# Patient Record
Sex: Female | Born: 1975 | Race: Black or African American | Hispanic: No | Marital: Married | Smoking: Current some day smoker
Health system: Southern US, Community
[De-identification: ages and names within clinical notes are randomized; demographics above are authoritative.]

## PROBLEM LIST (undated history)

## (undated) DIAGNOSIS — M1990 Unspecified osteoarthritis, unspecified site: Secondary | ICD-10-CM

## (undated) DIAGNOSIS — I10 Essential (primary) hypertension: Secondary | ICD-10-CM

## (undated) DIAGNOSIS — E119 Type 2 diabetes mellitus without complications: Secondary | ICD-10-CM

## (undated) HISTORY — PX: JOINT REPLACEMENT: SHX530

## (undated) HISTORY — PX: BACK SURGERY: SHX140

## (undated) HISTORY — PX: COLONOSCOPY: SHX174

---

## 1999-02-12 ENCOUNTER — Emergency Department (HOSPITAL_COMMUNITY): Admission: EM | Admit: 1999-02-12 | Discharge: 1999-02-12 | Payer: Self-pay | Admitting: Emergency Medicine

## 1999-02-12 ENCOUNTER — Encounter: Payer: Self-pay | Admitting: Emergency Medicine

## 1999-08-28 ENCOUNTER — Emergency Department (HOSPITAL_COMMUNITY): Admission: EM | Admit: 1999-08-28 | Discharge: 1999-08-28 | Payer: Self-pay | Admitting: *Deleted

## 2000-01-11 ENCOUNTER — Emergency Department (HOSPITAL_COMMUNITY): Admission: EM | Admit: 2000-01-11 | Discharge: 2000-01-12 | Payer: Self-pay | Admitting: Emergency Medicine

## 2000-01-12 ENCOUNTER — Encounter: Payer: Self-pay | Admitting: Emergency Medicine

## 2000-05-29 ENCOUNTER — Ambulatory Visit (HOSPITAL_COMMUNITY): Admission: RE | Admit: 2000-05-29 | Discharge: 2000-05-29 | Payer: Self-pay | Admitting: *Deleted

## 2000-08-13 ENCOUNTER — Inpatient Hospital Stay (HOSPITAL_COMMUNITY): Admission: AD | Admit: 2000-08-13 | Discharge: 2000-08-16 | Payer: Self-pay | Admitting: *Deleted

## 2000-11-29 ENCOUNTER — Other Ambulatory Visit: Admission: RE | Admit: 2000-11-29 | Discharge: 2000-11-29 | Payer: Self-pay | Admitting: Obstetrics & Gynecology

## 2000-11-29 ENCOUNTER — Encounter: Admission: RE | Admit: 2000-11-29 | Discharge: 2000-11-29 | Payer: Self-pay | Admitting: Family Medicine

## 2000-12-06 ENCOUNTER — Ambulatory Visit (HOSPITAL_COMMUNITY): Admission: RE | Admit: 2000-12-06 | Discharge: 2000-12-06 | Payer: Self-pay | Admitting: Obstetrics

## 2001-02-12 ENCOUNTER — Emergency Department (HOSPITAL_COMMUNITY): Admission: EM | Admit: 2001-02-12 | Discharge: 2001-02-13 | Payer: Self-pay | Admitting: Emergency Medicine

## 2001-02-13 ENCOUNTER — Encounter: Payer: Self-pay | Admitting: Emergency Medicine

## 2001-03-08 ENCOUNTER — Encounter: Admission: RE | Admit: 2001-03-08 | Discharge: 2001-03-08 | Payer: Self-pay | Admitting: Family Medicine

## 2001-05-03 ENCOUNTER — Encounter: Admission: RE | Admit: 2001-05-03 | Discharge: 2001-05-03 | Payer: Self-pay | Admitting: Family Medicine

## 2001-06-05 ENCOUNTER — Inpatient Hospital Stay (HOSPITAL_COMMUNITY): Admission: AD | Admit: 2001-06-05 | Discharge: 2001-06-08 | Payer: Self-pay | Admitting: *Deleted

## 2001-06-08 ENCOUNTER — Inpatient Hospital Stay (HOSPITAL_COMMUNITY): Admission: AD | Admit: 2001-06-08 | Discharge: 2001-06-13 | Payer: Self-pay | Admitting: Obstetrics & Gynecology

## 2001-06-11 ENCOUNTER — Encounter: Payer: Self-pay | Admitting: *Deleted

## 2001-06-14 ENCOUNTER — Encounter: Admission: RE | Admit: 2001-06-14 | Discharge: 2001-06-14 | Payer: Self-pay | Admitting: Family Medicine

## 2001-07-04 ENCOUNTER — Encounter: Admission: RE | Admit: 2001-07-04 | Discharge: 2001-07-04 | Payer: Self-pay | Admitting: Family Medicine

## 2001-07-31 ENCOUNTER — Encounter: Admission: RE | Admit: 2001-07-31 | Discharge: 2001-07-31 | Payer: Self-pay | Admitting: Family Medicine

## 2002-07-30 ENCOUNTER — Inpatient Hospital Stay (HOSPITAL_COMMUNITY): Admission: AD | Admit: 2002-07-30 | Discharge: 2002-07-30 | Payer: Self-pay | Admitting: *Deleted

## 2002-10-14 ENCOUNTER — Encounter: Admission: RE | Admit: 2002-10-14 | Discharge: 2002-10-14 | Payer: Self-pay | Admitting: Family Medicine

## 2002-10-14 ENCOUNTER — Other Ambulatory Visit: Admission: RE | Admit: 2002-10-14 | Discharge: 2002-10-14 | Payer: Self-pay | Admitting: Family Medicine

## 2002-10-24 HISTORY — PX: TUBAL LIGATION: SHX77

## 2002-10-29 ENCOUNTER — Ambulatory Visit (HOSPITAL_COMMUNITY): Admission: RE | Admit: 2002-10-29 | Discharge: 2002-10-29 | Payer: Self-pay | Admitting: Family Medicine

## 2003-02-14 ENCOUNTER — Encounter (INDEPENDENT_AMBULATORY_CARE_PROVIDER_SITE_OTHER): Payer: Self-pay

## 2003-02-14 ENCOUNTER — Inpatient Hospital Stay (HOSPITAL_COMMUNITY): Admission: AD | Admit: 2003-02-14 | Discharge: 2003-02-18 | Payer: Self-pay | Admitting: Family Medicine

## 2003-06-01 ENCOUNTER — Encounter: Payer: Self-pay | Admitting: Emergency Medicine

## 2003-06-01 ENCOUNTER — Encounter: Payer: Self-pay | Admitting: Orthopedic Surgery

## 2003-06-01 ENCOUNTER — Emergency Department (HOSPITAL_COMMUNITY): Admission: EM | Admit: 2003-06-01 | Discharge: 2003-06-01 | Payer: Self-pay | Admitting: *Deleted

## 2005-08-15 ENCOUNTER — Ambulatory Visit: Payer: Self-pay | Admitting: Family Medicine

## 2005-11-30 ENCOUNTER — Ambulatory Visit: Payer: Self-pay | Admitting: Family Medicine

## 2008-02-19 ENCOUNTER — Emergency Department (HOSPITAL_COMMUNITY): Admission: EM | Admit: 2008-02-19 | Discharge: 2008-02-19 | Payer: Self-pay | Admitting: Emergency Medicine

## 2009-03-20 ENCOUNTER — Ambulatory Visit: Payer: Self-pay | Admitting: Radiology

## 2009-03-20 ENCOUNTER — Emergency Department (HOSPITAL_BASED_OUTPATIENT_CLINIC_OR_DEPARTMENT_OTHER): Admission: EM | Admit: 2009-03-20 | Discharge: 2009-03-20 | Payer: Self-pay | Admitting: Emergency Medicine

## 2010-11-25 ENCOUNTER — Ambulatory Visit: Admit: 2010-11-25 | Payer: Self-pay | Admitting: Nurse Practitioner

## 2011-02-18 ENCOUNTER — Other Ambulatory Visit: Payer: Self-pay | Admitting: Orthopedic Surgery

## 2011-02-18 DIAGNOSIS — M25529 Pain in unspecified elbow: Secondary | ICD-10-CM

## 2011-02-22 ENCOUNTER — Other Ambulatory Visit: Payer: Self-pay

## 2011-02-23 ENCOUNTER — Inpatient Hospital Stay: Admission: RE | Admit: 2011-02-23 | Payer: Self-pay | Source: Ambulatory Visit

## 2011-03-11 NOTE — Consult Note (Signed)
[facility]  Patient:    Kristi Daniels, Kristi Daniels Visit Number: [id number] MRN: [id number]          Service Type: MED Location: [phone] Attending Physician:  Antionette Char Consultation Date: 06/11/01 Adm. Date:  [id number]   CC:         Roseanna Rainbow, M.D.  [facility] c/o Dr. Clide Dales   Consultation Report  NEUROLOGY CONSULTATION  ADMISSION DATE:               06/08/2001.  DATE OF BIRTH:                11-06-1975.  I was asked to see Kristi Daniels, a 35 year old gravida 4, para 3 woman, who has had headaches and hypertension since delivering her third child. The patient delivered the child on August 13 and was in the hospital for three days. Her blood pressures were quite labile. She was sent home on pain medication and came back the same day with a very severe headache and blood pressure of 230/120.  She was treated aggressively initially with labetalol with good effect and then with magnesium sulfate with only fair effect. The patient is receiving 2.5 g/hr and is achieved a level of 7.2 mg/dl. This has failed to control her blood pressure. In addition, has failed to control her headaches.  She describes the pain as retrorbital, pounding, associated with definite sensitivity to light and some sensitivity to sound. She has not had true nausea or vomiting. She is incapacitated by the headaches and has not responded to Percocet, Darvocet, Tylenol, or Motrin.  I was asked to see her to determine the etiology of her headaches and to make recommendations for further workup and treatment.  REVIEW OF SYSTEMS:            The patient has had no closed head injury. She did not have significant headache problems throughout the pregnancy, and only developed them toward the end, in part, associated with her hypertension.  The patient has not had significant problems with either muscle contraction or migraine headaches in  the past. She has not had muscle contraction or migrainous headaches in the previous two pregnancies. She has not had significant intercurrent infections in the head, neck, lungs, GI, GU. No rash, anemia, bruisability, diabetes, or thyroid disease. Neurologically, she has not had diplopia, dysarthria, dysphasia, tinnitus, syncope, vertigo, weakness, numbness, tingling, or loss of bowel and bladder control. When she came in she had blurred vision. She also had numbness down her left side involving the arm moreso than the leg with some numbness in the right hand. The symptoms were more of paresthesias than anything else. Those symptoms have subsided as has her blurred vision. Her headaches have gone from very severe pain down to moderate pain. Light and increased activity seem to worsen things. She has been able to keep down foods. I was asked to see her to determine the etiologies of the headaches.  PAST SURGICAL HISTORY:        None.  FAMILY HISTORY:               Migraine headaches in mother. No other history of significant pregnancy induced hypertension, diabetes, and no familial neurologic disorders.  SOCIAL HISTORY:               The patient is a single parent. She lives with her mother and stepfather. Her stepfathers sister is taking care of the older two children and  maternal grandmother is taking care of the baby.  The patient has graduated from 12th grade from [employer] and hopes to go on to study to be CNA. This has been thwarted by pregnancies and children now age 34, 47 months, and newborn.  I am unaware of use of tobacco or alcohol in this patient. She has been tried on a variety of medications which have failed, see above.  ALLERGIES:                    She has no known allergies to medications.  CURRENT MEDICATIONS:          Magnesium sulfate, Motrin, and Percocet. She had been on Darvocet as well.  PHYSICAL EXAMINATION TODAY:  GENERAL:                       This is a pleasant woman in no acute distress. She says that her headache is 6 on a scale of 10.  VITALS SIGNS:                 Blood pressure 153/102, resting pulse 79, respirations 20, temperature 97.8. Pulse oximetry 98%.  EARS, NOSE, AND THROAT:       No signs of infection.  NECK:                         Supple. Range of motion full. No bruits.  No tenderness in the external auditory canals, temporomandibular joints, temples, anterior cervical spine, or craniocervical junction. She has slight orbital pain both supra- and infra- as well as the eye itself.  LUNGS:                        Clear.  HEART:                        No murmur, pulses normal.  ABDOMEN:                      Soft, bowel sounds normal, no hepatosplenomegaly.  EXTREMITIES:                  No edema or cyanosis.  NEUROLOGIC:                   Mental status: Patient was awake, alert, no dysphasia or dyspraxia. Cranial nerves: Round, reactive pupils with photophobia. Fundi were sharp. Discs normal vessels, no hemorrhage. Visual fields full. Symmetric facial strength. Midline tongue and uvula. Air conduction greater than bone conduction bilaterally.  Motor examination: Normal strength, tone, and mass. Good fine motor movements. No pronator, drift. Sensation intact to cold, vibration, and stereognosis. Cerebellar examination: Good finger-to-nose, rapid repetitive movements, no tremors, dystaxia, dysmetria. Gait and station was normal. Patient was able to get up on her heels and toes and perform tandem without falling. Romberg was negative.  Deep tendon reflexes were normal at the knees and biceps, absent elsewhere. She had bilateral flexor plantar responses.  IMPRESSION:                   1. Headache disorder. I suspect migraine, now                                  without aura, 346.10. The patient may have  had an initial aura with spots before her eye                                    and numbness.                               2. Hypertension versus toxemia, 404.10.                               3. Gravida 4, para 3-0-1-3 woman.                               4. Nonfocal neurologic examination.                               5. Cranial CT scan reviewed personally by me was                                  normal without and with contrast.  RECOMMENDATIONS:              1. Before we can treat her headache with DHE or                                  triptan medications, we need to get her blood                                  pressure under control.                               2. Discontinue magnesium sulfate. Magnesium                                  sometimes will treat migraines but obviously                                  has not controlled her headaches nor her                                  hypertension.                               3. Start a beta blocker propanolol 30 mg twice                                  a day or atenolol 25 mg twice a day.                               4. If blood pressure is down to the range of  120-130/80 and headaches continue, we can use                                  DHE 45 1 ml IV preceded 15 minutes before by                                  Reglan 10 mg IV to decrease the side effects                                  of nausea. Decadron 10 mg IV follows DHE.                                  This could be repeated every eight hours up                                  to nine times. I discussed this with                                  Dr. Burnadette Peter.  I appreciate the opportunity to see the patient and will follow up based on her clinical needs. DD:  06/11/01 TD:  06/11/01 Job: [id number]

## 2011-03-11 NOTE — Discharge Summary (Signed)
[facility]  Patient:    Kristi Daniels, Kristi Daniels Visit Number: [id number] MRN: [id number]          Service Type: MED Location: [phone] Attending Physician:  Antionette Char Dictated by:   Kevin Fenton, M.D. Adm. Date:  06/08/2001 Disc. Date: 06/13/2001                             Discharge Summary  DATE OF BIRTH:                [birth year].  DISCHARGE MEDICATIONS:        Hydrochlorothiazide 25 mg p.o. q.d.  DISCHARGE FOLLOWUP:           The patient is to follow up at [facility] tomorrow, Thursday, August 22, at 10:15. She is notify family practice center for any headache.  DISCHARGE DIAGNOSES:          1. Preeclampsia.                               2. Headache.  CONSULTS:                     Dr. Sharene Skeans of neurology.  BRIEF HOSPITAL COURSE:        This is a 35 year old, G4, P3-0-1-3, who is postpartum day #3 when she presented to the MAU with headache and blood pressures of up to 211/132, 178/95, 203/113. She had been given magnesium in labor secondary to scotoma, increased blood pressures and elevated uric acid. The patient had an SVD and was transferred to [facility]. That was continued for 24 hours and patient was discharged with diuresis. However, the patient did return that same day with a headache, scotoma, and elevated blood pressures. She was admitted for the blood pressures and the headache. The patient received a CT scan of the head to rule out bleed which was negative. We also consulted neurology secondary to persistent headaches. Dr. Sharene Skeans saw her and felt she had a nonfocal neuro exam and diagnosed a headache disorder suspecting migraine without aura and chronic hypertension versus toxemia. His recommendation was to get her blood pressure under control before offering ergotamines or triptans, and to start her on propanolol 30 mg b.i.d. or atenolol 25 mg to control her blood pressure. Once the patient had  normal blood pressure she would be able to start on DHE and Reglan or ______ for her headaches. The patient was started on propanolol 40 mg b.i.d.; however at discharge the patient still had blood pressures of 150s-160s/90s, however, they were stable, so the patient was discharged, propanolol was discontinued and patient was started on hydrochlorothiazide 25 mg po q.d. She is to follow up with Dr. Lennette Bihari as described above. At discharge, she no longer had headache and was doing well. Dictated by:   Kevin Fenton, M.D. Attending Physician:  Antionette Char DD:  06/13/01 TD:  06/13/01 Job: [id number]

## 2011-03-11 NOTE — Discharge Summary (Signed)
   NAME:  Kristi Daniels, NARDELLI                       ACCOUNT NO.:  [id number]   MEDICAL RECORD NO.:  [id number]                   PATIENT TYPE:  INP   LOCATION:  9124                                 FACILITY:  [facility]   PHYSICIAN:  Tanya S. Shawnie Pons, M.D.                DATE OF BIRTH:  [birth year]   DATE OF ADMISSION:  02/14/2003  DATE OF DISCHARGE:  02/18/2003                                 DISCHARGE SUMMARY   DISCHARGE DIAGNOSES:  1. Intrauterine pregnancy at [redacted] weeks gestation, delivered.  2. Abruptio placenta.  3. Pregnancy-induced hypertension.   PROCEDURES:  Primary low transverse C section as well as IV antibiotics and  magnesium sulfate.   REASON FOR ADMISSION:  Briefly, the patient is a 35 year old G4 P3-0-0-3 who  is at 75 weeks who presented with acute abdominal pain, vaginal bleeding,  had a nonreassuring fetal heart rate tracing and was immediately taken to  the OR for a cesarean delivery.   HOSPITAL COURSE:  As stated previously, the patient was taken to the OR for  cesarean delivery and she delivered a viable female infant with Apgars of 4  and 7 and a cord pH of 6.9.  The patient's blood pressures were noted to be  elevated in the 150 to 160s over 90 to 100.  She had 4+ DTRs and clonus and  was transferred to the AICU postoperatively and given magnesium sulfate.  Her magnesium was continued for approximately 48 hours postoperatively  before being discontinued.  She had had good urine output and a good amount  of diuresis and was therefore transferred to the floor on postoperative day  #2.  She remained afebrile.  Blood pressures remained elevated on  postoperative day #3.  She was watched for anther day and her PIH labs  seemed to be improving; her blood pressures were also improving.  It was  felt she was stable for discharge.   DISCHARGE DISPOSITION:  She was discharged home.   CONDITION:  Good.   PERTINENT MEDICATIONS:  She was on ibuprofen, Percocet, prenatal  vitamins,  and Toprol-XL.   ACTIVITY:  Pelvic rest for the next six weeks.   DIET:  Regular.   FOLLOW-UP:  In six weeks at [facility] for a postoperative visit.   DISCHARGE LABORATORY DATA:  Her discharge hemoglobin was 9.7.                                               Shelbie Proctor. Shawnie Pons, M.D.    TSP/MEDQ  D:  04/10/2003  T:  04/10/2003  Job:  [id number]

## 2011-03-11 NOTE — Op Note (Signed)
NAME:  Kristi Daniels, Kristi Daniels                       ACCOUNT NO.:  [id number]   MEDICAL RECORD NO.:  [id number]                   PATIENT TYPE:  INP   LOCATION:  9157                                 FACILITY:  [facility]   PHYSICIAN:  Phil D. Okey Dupre, M.D.                  DATE OF BIRTH:  1976-09-26   DATE OF PROCEDURE:  02/14/2003  DATE OF DISCHARGE:                                 OPERATIVE REPORT   PROCEDURE:  1. Low transverse cesarean section.  2. Bilateral tubal ligation, modified [place]oy.   PREOPERATIVE DIAGNOSES:  1. Acute abruptio placenta.  2. Voluntary sterilization.   SURGEON:  Javier Glazier. Okey Dupre, M.D.   FIRST ASSISTANT:  Franco Collet, M.D.   ANESTHESIA:  General.   OPERATIVE FINDINGS:  On entering the uterine cavity the cavity was full of  blood.  The baby after being delivered and the placenta separating quickly,  large clots were found in the fundus of the uterus.  There was Couvelaire  uterus, however.   PROCEDURE:  Under satisfactory general anesthesia with the patient in a  dorsal supine position, the abdomen was prepped and draped in the usual  sterile manner with a Foley catheter in the urinary bladder and entered  through a midline incision extending from just under the umbilicus to just  above the symphysis pubis.  The abdomen was entered by layers.  On entering  the peritoneal cavity the visceroperitoneum on the anterior surface of the  uterus opened transversely by sharp dissection.  The bladder pushed away  from the lower uterine segment which was entered by sharp and blunt  dissection and large clots extruded from the uterus immediately on entry  there was a scant amount of fluid and the baby quickly delivered from a  vertex presentation.  The cord was milked toward the baby as per  pediatrician.  Cord doubly clamped, divided.  Baby handed to pediatricians.  Placenta followed spontaneously.  No organized clot could be seen behind the  placenta.  However, there was  much organized clot at the fundus of the  uterus which probably was equivalent to 1 unit of blood evacuated.  The  uterus was then closed with continuous running locked 0 Vicryl in an  atraumatic needle and an area of oozing in the mid portion of the suture  line was controlled with another figure-of-eight of the same suture.  The  area observed.  No bleeding was noted.  Each fallopian tube was grasped in  the mid portion and an opening made with a hemostat and an avascular portion  of meso beneath the tube.  One plain suture was brought through this  opening, tied around the distal and proximal end of the tube forming a loop  of approximately 2 cm above the tube.  Second tie placed just below the  aforementioned tie.  That was cut short.  Portion of the tube above the  sutures were then excised and the ends of those sutures caused by that  excision were coagulated with hot cautery.  Area was observed for bleeding.  None was noted.  The fascia was closed with continuous running alternating  locked 1 PDS suture on an atraumatic needle.  Subcutaneous bleeders were  controlled with hot cautery and skin edge approximated with skin staples.  Dry, sterile  dressing was applied.  The patient transferred to recovery room with  approximately 600 mL blood loss.  Foley catheter draining clear amber urine  at the end of procedure.  Placenta was sent for pathologic diagnosis.  The  baby was a female.  No weight available at this time.  Apgar of 4/7 and a pH  of 6.9.                                               Phil D. Okey Dupre, M.D.    PDR/MEDQ  D:  02/14/2003  T:  02/14/2003  Job:  [id number]

## 2011-07-19 LAB — POCT I-STAT, CHEM 8
BUN: 10
Chloride: 104
HCT: 46
Potassium: 3.8
Sodium: 138

## 2011-07-19 LAB — POCT CARDIAC MARKERS
Myoglobin, poc: 85.9
Operator id: 146091
Troponin i, poc: <0.05

## 2011-07-19 LAB — CBC
HCT: 42.4
Platelets: 266
WBC: 9.2

## 2015-03-22 ENCOUNTER — Encounter (HOSPITAL_COMMUNITY): Payer: Self-pay | Admitting: *Deleted

## 2015-03-22 ENCOUNTER — Emergency Department (HOSPITAL_COMMUNITY)
Admission: EM | Admit: 2015-03-22 | Discharge: 2015-03-22 | Disposition: A | Discharge status: Home / Self Care | Payer: Self-pay | Attending: Emergency Medicine | Admitting: Emergency Medicine

## 2015-03-22 DIAGNOSIS — M62838 Other muscle spasm: Secondary | ICD-10-CM | POA: Insufficient documentation

## 2015-03-22 MED ORDER — MELOXICAM 7.5 MG PO TABS: 15.0000 mg | ORAL_TABLET | Freq: Every day | ORAL | Status: DC

## 2015-03-22 MED ORDER — HYDROCODONE-ACETAMINOPHEN 5-325 MG PO TABS
2.0000 | ORAL_TABLET | Freq: Once | ORAL | Status: AC
  Administered 2015-03-22: 2 via ORAL
  Filled 2015-03-22: qty 2

## 2015-03-22 MED ORDER — DIAZEPAM 5 MG PO TABS: 5.0000 mg | ORAL_TABLET | Freq: Two times a day (BID) | ORAL | Status: DC

## 2015-03-22 MED ORDER — HYDROCODONE-ACETAMINOPHEN 5-325 MG PO TABS: 1.0000 | ORAL_TABLET | ORAL | Status: DC | PRN

## 2015-03-22 MED ORDER — DIAZEPAM 5 MG PO TABS
5.0000 mg | ORAL_TABLET | Freq: Once | ORAL | Status: AC
  Administered 2015-03-22: 5 mg via ORAL
  Filled 2015-03-22: qty 1

## 2015-03-22 NOTE — ED Provider Notes (Signed)
CSN: [id number]     Arrival date & time 03/22/15  4098 History   First MD Initiated Contact with Patient 03/22/15 0350     Chief Complaint  Patient presents with  . Neck Pain     (Consider location/radiation/quality/duration/timing/severity/associated sxs/prior Treatment) HPI Comments: Patient is 39 year old female who presents to the emergency department for further evaluation of right-sided neck pain. Patient states that symptoms began upon waking yesterday. She states that pain has progressively worsened. She describes the pain as an aching, burning pain. Patient denies radiation of the pain. She states that she took 2 Excedrin tablets for symptoms without relief. Pain is worse with extension of the neck and movement of the right shoulder. She denies any direct trauma or injury to the area, though she states that she does frequent heavy lifting at work. She states that she always tries to lift "the right away". Patient denies fever, extremity numbness/paresthesias, and extremity weakness. She reports similar symptoms in the past which resolved after 1-2 days.  Patient is a 39 y.o. female presenting with neck pain. The history is provided by the patient. No language interpreter was used.  Neck Pain   History reviewed. No pertinent past medical history. Past Surgical History  Procedure Laterality Date  . Joint replacement      elbow    History reviewed. No pertinent family history. History  Substance Use Topics  . Smoking status: Never Smoker   . Smokeless tobacco: Never Used  . Alcohol Use: No   OB History    No data available      Review of Systems  Musculoskeletal: Positive for myalgias and neck pain.  All other systems reviewed and are negative.   Allergies  Review of patient's allergies indicates no known allergies.  Home Medications   Prior to Admission medications   Medication Sig Start Date End Date Taking? Authorizing Provider  diazepam (VALIUM) 5 MG tablet  Take 1 tablet (5 mg total) by mouth 2 (two) times daily. 03/22/15   Antony Madura, PA-C  HYDROcodone-acetaminophen (NORCO/VICODIN) 5-325 MG per tablet Take 1 tablet by mouth every 4 (four) hours as needed for severe pain. 03/22/15   Antony Madura, PA-C  meloxicam (MOBIC) 7.5 MG tablet Take 2 tablets (15 mg total) by mouth daily. 03/22/15   Antony Madura, PA-C   BP 156/95 mmHg  Pulse 70  Temp(Src) 97.8 F (36.6 C) (Oral)  Ht  (1.626 m)  Wt 180 lb (81.647 kg)  BMI 30.88 kg/m2  SpO2 99%  LMP 02/23/2015   Physical Exam  Constitutional: She is oriented to person, place, and time. She appears well-developed and well-nourished. No distress.  Nontoxic/nonseptic appearing  HENT:  Head: Normocephalic and atraumatic.  Eyes: Conjunctivae and EOM are normal. No scleral icterus.  Neck: Normal range of motion.  Normal range of motion of the neck. Tenderness to palpation to the right cervical paraspinal muscles which is mild. No bony deformities, step-offs, or crepitus.  Cardiovascular: Normal rate, regular rhythm and intact distal pulses.   Distal radial pulse 2+ bilaterally  Pulmonary/Chest: Effort normal. No respiratory distress.  Respirations even and unlabored  Musculoskeletal: Normal range of motion. She exhibits tenderness.       Back:  Tenderness to palpation along the course of the right trapezius muscle. There is mild spasm. No bony deformities, step-offs, or crepitus to the thoracic midline.  Neurological: She is alert and oriented to person, place, and time. She exhibits normal muscle tone. Coordination normal.  GCS 15. Patient  has equal grip strength bilaterally. 5/5 strength against resistance noted in bilateral upper extremities.  Skin: Skin is warm and dry. No rash noted. She is not diaphoretic. No erythema. No pallor.  Psychiatric: She has a normal mood and affect. Her behavior is normal.  Nursing note and vitals reviewed.   ED Course  Procedures (including critical care  time) Labs Review Labs Reviewed - No data to display  Imaging Review No results found.   EKG Interpretation None      MDM   Final diagnoses:  Trapezius muscle spasm    39 year old female presents to the emergency department for further evaluation of right-sided neck pain. Symptoms consistent with muscle spasm. Patient is neurovascularly intact. No direct trauma or injury to raise concern for underlying bony process. Patient has equal grip strength bilaterally with normal strength against resistance. Sensation to light touch intact. Symptoms to be managed as outpatient with NSAIDs and muscle relaxers. Short course Norco given for pain control as needed. Have advised ice and heat to area of pain. Primary care follow up advised and return precautions given. Patient agreeable to plan with no unaddressed concerns. Patient discharged in good condition.   Filed Vitals:   03/22/15 0337 03/22/15 0342 03/22/15 0345  BP: 170/105  156/95  Pulse:  78 70  Temp:  97.8 F (36.6 C)   TempSrc:  Oral   Height:  5\' 4"  (1.626 m)   Weight:  180 lb (81.647 kg)   SpO2:  99% 99%       Antony MaduraKelly Eldonna Neuenfeldt, PA-C 03/22/15 0409  Derwood KaplanAnkit Nanavati, MD 03/23/15 0830

## 2015-03-22 NOTE — ED Notes (Signed)
Pt. Woke up yesterday with a sore neck and as the day progressed the pain got worse. Pt. States she cant turn her neck

## 2015-03-22 NOTE — ED Notes (Signed)
Pt. Left with all belongings and refused wheelchair 

## 2015-03-22 NOTE — Discharge Instructions (Signed)
Recommend Mobic and Valium as prescribed for pain and spasm. Take Norco as needed for severe pain. Alternate ice and heat to areas of injury, 3-4 times per day for 15-20 minutes each time. Follow-up with your primary care doctor for a recheck of symptoms. Return to the emergency department as needed if symptoms worsen.  Muscle Cramps and Spasms Muscle cramps and spasms occur when a muscle or muscles tighten and you have no control over this tightening (involuntary muscle contraction). They are a common problem and can develop in any muscle. The most common place is in the calf muscles of the leg. Both muscle cramps and muscle spasms are involuntary muscle contractions, but they also have differences:   Muscle cramps are sporadic and painful. They may last a few seconds to a quarter of an hour. Muscle cramps are often more forceful and last longer than muscle spasms.  Muscle spasms may or may not be painful. They may also last just a few seconds or much longer. CAUSES  It is uncommon for cramps or spasms to be due to a serious underlying problem. In many cases, the cause of cramps or spasms is unknown. Some common causes are:   Overexertion.   Overuse from repetitive motions (doing the same thing over and over).   Remaining in a certain position for a long period of time.   Improper preparation, form, or technique while performing a sport or activity.   Dehydration.   Injury.   Side effects of some medicines.   Abnormally low levels of the salts and ions in your blood (electrolytes), especially potassium and calcium. This could happen if you are taking water pills (diuretics) or you are pregnant.  Some underlying medical problems can make it more likely to develop cramps or spasms. These include, but are not limited to:   Diabetes.   Parkinson disease.   Hormone disorders, such as thyroid problems.   Alcohol abuse.   Diseases specific to muscles, joints, and bones.    Blood vessel disease where not enough blood is getting to the muscles.  HOME CARE INSTRUCTIONS   Stay well hydrated. Drink enough water and fluids to keep your urine clear or pale yellow.  It may be helpful to massage, stretch, and relax the affected muscle.  For tight or tense muscles, use a warm towel, heating pad, or hot shower water directed to the affected area.  If you are sore or have pain after a cramp or spasm, applying ice to the affected area may relieve discomfort.  Put ice in a plastic bag.  Place a towel between your skin and the bag.  Leave the ice on for 15-20 minutes, 03-04 times a day.  Medicines used to treat a known cause of cramps or spasms may help reduce their frequency or severity. Only take over-the-counter or prescription medicines as directed by your caregiver. SEEK MEDICAL CARE IF:  Your cramps or spasms get more severe, more frequent, or do not improve over time.  MAKE SURE YOU:   Understand these instructions.  Will watch your condition.  Will get help right away if you are not doing well or get worse. Document Released: 04/01/2002 Document Revised: 02/04/2013 Document Reviewed: 09/26/2012 [facility] Patient Information 2015 [employer] [place], [place]. This information is not intended to replace advice given to you by your health care provider. Make sure you discuss any questions you have with your health care provider.

## 2015-08-19 ENCOUNTER — Emergency Department (HOSPITAL_COMMUNITY)
Admission: EM | Admit: 2015-08-19 | Discharge: 2015-08-19 | Disposition: A | Discharge status: Home / Self Care | Payer: Self-pay | Attending: Emergency Medicine | Admitting: Emergency Medicine

## 2015-08-19 ENCOUNTER — Encounter (HOSPITAL_COMMUNITY): Payer: Self-pay | Admitting: Emergency Medicine

## 2015-08-19 DIAGNOSIS — X58XXXA Exposure to other specified factors, initial encounter: Secondary | ICD-10-CM | POA: Insufficient documentation

## 2015-08-19 DIAGNOSIS — Z791 Long term (current) use of non-steroidal anti-inflammatories (NSAID): Secondary | ICD-10-CM | POA: Insufficient documentation

## 2015-08-19 DIAGNOSIS — M779 Enthesopathy, unspecified: Secondary | ICD-10-CM | POA: Insufficient documentation

## 2015-08-19 DIAGNOSIS — S46911A Strain of unspecified muscle, fascia and tendon at shoulder and upper arm level, right arm, initial encounter: Secondary | ICD-10-CM | POA: Insufficient documentation

## 2015-08-19 DIAGNOSIS — Y9389 Activity, other specified: Secondary | ICD-10-CM | POA: Insufficient documentation

## 2015-08-19 DIAGNOSIS — T148XXA Other injury of unspecified body region, initial encounter: Secondary | ICD-10-CM

## 2015-08-19 DIAGNOSIS — Y9289 Other specified places as the place of occurrence of the external cause: Secondary | ICD-10-CM | POA: Insufficient documentation

## 2015-08-19 DIAGNOSIS — Z79899 Other long term (current) drug therapy: Secondary | ICD-10-CM | POA: Insufficient documentation

## 2015-08-19 DIAGNOSIS — Y998 Other external cause status: Secondary | ICD-10-CM | POA: Insufficient documentation

## 2015-08-19 MED ORDER — PREDNISONE 10 MG PO TABS: ORAL_TABLET | ORAL | Status: DC

## 2015-08-19 MED ORDER — CYCLOBENZAPRINE HCL 5 MG PO TABS: 10.0000 mg | ORAL_TABLET | Freq: Two times a day (BID) | ORAL | Status: DC | PRN

## 2015-08-19 NOTE — Discharge Instructions (Signed)
Tendinitis and Tenosynovitis  °Tendinitis is inflammation of the tendon. Tenosynovitis is inflammation of the lining around the tendon (tendon sheath). These painful conditions often occur at once. Tendons attach muscle to bone. To move a limb, force from the muscle moves through the tendon, to the bone. These conditions often cause increased pain when moving. Tendinitis may be caused by a small or partial tear in the tendon.  °SYMPTOMS  °· Pain, tenderness, redness, bruising, or swelling at the injury. °· Loss of normal joint movement. °· Pain that gets worse with use of the muscle and joint attached to the tendon. °· Weakness in the tendon, caused by calcium build up that may occur with tendinitis. °· Commonly affected tendons: °¨ Achilles tendon (calf of leg). °¨ Rotator cuff (shoulder joint). °¨ Patellar tendon (kneecap to shin). °¨ Peroneal tendon (ankle). °¨ Posterior tibial tendon (inner ankle). °¨ Biceps tendon (in front of shoulder). °CAUSES  °· Sudden strain on a flexed muscle, muscle overuse, sudden increase or change in activity, vigorous activity. °· Result of a direct hit (less common). °· Poor muscle action (biomechanics). °RISK INCREASES WITH: °· Injury (trauma). °· Too much exercise. °· Sudden change in athletic activity. °· Incorrect exercise form or technique. °· Poor strength and flexibility. °· Not warming-up properly before activity. °· Returning to activity before healing is complete. °PREVENTION  °· Warm-up and stretch properly before activity. °· Maintain physical fitness: °¨ Joint flexibility. °¨ Muscle strength and endurance. °¨ Fitness that increases heart rate. °· Learn and use proper exercise techniques. °· Use rehabilitation exercises to strengthen weak muscles and tendons. °· Ice the tendon after activity, to reduce recurring inflammation. °· Wear proper fitting protective equipment for specific tendons, when indicated. °PROGNOSIS  °When treated properly, can be cured in 6 to 8 weeks.  Recovery may take longer, depending on degree of injury.  °RELATED COMPLICATIONS  °· Re-injury or recurring symptoms. °· Permanent weakness or joint stiffness, if injury is severe and recovery is not completed. °· Delayed healing, if sports are started before healing is complete. °· Tearing apart (rupture) of the inflamed tendon. Tendinitis means the tendon is injured and must recover. °TREATMENT  °Treatment first involves ice, medicine, and rest from aggravating activities. This reduces pain and inflammation. Modifying your activity may be considered to prevent recurring injury. A brace, elastic bandage wrap, splint, cast, or sling may be prescribed to protect the joint for a short period. After that period, strengthening and stretching exercise may help to regain strength and full range of motion. If the condition persists, despite non-surgical treatment, surgery may be recommended to remove the inflamed tendon lining. Corticosteroid injections may be given to reduce inflammation. However, these injections may weaken the tendon and increase your risk for tendon rupture. °MEDICATION  °· If pain medicine is needed, nonsteroidal anti-inflammatory medicines (aspirin and ibuprofen), or other minor pain relievers (acetaminophen), are often recommended. °· Do not take pain medicine for 7 days before surgery. °· Prescription pain relievers are usually prescribed only after surgery. Use only as directed and only as much as you need. °· Ointments applied to the skin may be helpful. °· Corticosteroid injections may be given to reduce inflammation. However, this may increase your risk of a tendon rupture. °HEAT AND COLD °· Cold treatment (icing) relieves pain and reduces inflammation. Cold treatment should be applied for 10 to 15 minutes every 2 to 3 hours, and immediately after activity that aggravates your symptoms. Use ice packs or an ice massage. °· Heat   treatment may be used before performing stretching and strengthening  activities prescribed by your caregiver, physical therapist, or athletic trainer. Use a heat pack or a warm water soak. °SEEK MEDICAL CARE IF:  °· Symptoms get worse or do not improve, despite treatment. °· Pain becomes too much to tolerate. °· You develop numbness or tingling. °· Toes become cold, or toenails become blue, gray, or dark colored. °· New, unexplained symptoms develop. (Drugs used in treatment may produce side effects.) °  °This information is not intended to replace advice given to you by your health care provider. Make sure you discuss any questions you have with your health care provider. °  °Document Released: 10/10/2005 Document Revised: 01/02/2012 Document Reviewed: 01/22/2009 °Elsevier Interactive Patient Education ©2016 Elsevier Inc. ° °

## 2015-08-19 NOTE — ED Notes (Signed)
Pt sts right shoulder pain into elbow x several weeks getting more severe

## 2015-08-19 NOTE — ED Notes (Signed)
Pt is in stable condition upon d/c and ambulates from ED. 

## 2015-08-19 NOTE — ED Provider Notes (Signed)
CSN: [id number]     Arrival date & time 08/19/15  1625 History  By signing my name below, I, Kristi Daniels, attest that this documentation has been prepared under the direction and in the presence of Teressa Lower, NP. Electronically Signed: Lyndel Daniels, ED Scribe. 08/19/2015. 4:48 PM.   Chief Complaint  Patient presents with  . Shoulder Pain   The history is provided by the patient. No language interpreter was used.   HPI Comments: Kristi Daniels is a 39 y.o. female, with no chronic medical conditions, who presents to the Emergency Department complaining of gradually worsening, constant posterior right shoulder pain that radiates to right elbow onset 3 days ago.The pt uses her right arm for repetitive motions while at work. She notes similar pain in her right shoulder in the past but she did not experience pain in her elbow at that time. No PMhx of DM. Denies numbness or weakness. No overlying skin changes. NKDA. LNMP this month.   History reviewed. No pertinent past medical history. Past Surgical History  Procedure Laterality Date  . Joint replacement      elbow    History reviewed. No pertinent family history. Social History  Substance Use Topics  . Smoking status: Never Smoker   . Smokeless tobacco: Never Used  . Alcohol Use: No   OB History    No data available     Review of Systems  Musculoskeletal: Positive for arthralgias ( right shoulder, right elbow ).  Skin: Negative for color change.  Neurological: Negative for weakness and numbness.  All other systems reviewed and are negative.  Allergies  Review of patient's allergies indicates no known allergies.  Home Medications   Prior to Admission medications   Medication Sig Start Date End Date Taking? Authorizing Provider  diazepam (VALIUM) 5 MG tablet Take 1 tablet (5 mg total) by mouth 2 (two) times daily. 03/22/15   Antony Madura, PA-C  HYDROcodone-acetaminophen (NORCO/VICODIN) 5-325 MG per tablet Take 1  tablet by mouth every 4 (four) hours as needed for severe pain. 03/22/15   Antony Madura, PA-C  meloxicam (MOBIC) 7.5 MG tablet Take 2 tablets (15 mg total) by mouth daily. 03/22/15   Antony Madura, PA-C   BP 148/87 mmHg  Pulse 90  Temp(Src) 97.6 F (36.4 C) (Oral)  Resp 18  SpO2 98% Physical Exam  Constitutional: She is oriented to person, place, and time. She appears well-developed and well-nourished. No distress.  HENT:  Head: Normocephalic.  Eyes: Conjunctivae are normal.  Neck: Normal range of motion. Neck supple.  Cardiovascular: Normal rate.   Pulmonary/Chest: Effort normal. No respiratory distress.  Abdominal: Soft. Bowel sounds are normal. There is no tenderness.  Musculoskeletal: Normal range of motion. She exhibits no tenderness.  Tender to the lateral elbow. No redness or swelling. Full rom. Tender in the posterior right shoulder. Full rom  Neurological: She is alert and oriented to person, place, and time. She exhibits normal muscle tone. Coordination normal.  Equal grip strength bilaterally  Skin: Skin is warm and dry.  Psychiatric: She has a normal mood and affect. Her behavior is normal.  Nursing note and vitals reviewed.  ED Course  Procedures  DIAGNOSTIC STUDIES: Oxygen Saturation is 98% on RA, normal by my interpretation.    COORDINATION OF CARE: 4:49 PM Discussed treatment plan with pt at bedside and pt agreed to plan. Will prescribe steroid course and give work note.    MDM   Final diagnoses:  Tendonitis  Muscle strain  Will treat with flexeril and prednisone. Discussed follow up and return precautions.   I personally performed the services described in this documentation, which was scribed in my presence. The recorded information has been reviewed and is accurate.    Teressa LowerVrinda Fue Cervenka, NP 08/19/15 1741  Cathren LaineKevin Steinl, MD 08/19/15 [phone]

## 2015-09-24 ENCOUNTER — Emergency Department (HOSPITAL_COMMUNITY)
Admission: EM | Admit: 2015-09-24 | Discharge: 2015-09-24 | Disposition: A | Discharge status: Home / Self Care | Payer: Managed Care, Other (non HMO) | Attending: Emergency Medicine | Admitting: Emergency Medicine

## 2015-09-24 ENCOUNTER — Encounter (HOSPITAL_COMMUNITY): Payer: Self-pay | Admitting: *Deleted

## 2015-09-24 ENCOUNTER — Encounter (HOSPITAL_COMMUNITY): Payer: Self-pay

## 2015-09-24 ENCOUNTER — Emergency Department (HOSPITAL_COMMUNITY)
Admission: EM | Admit: 2015-09-24 | Discharge: 2015-09-24 | Discharge status: Against Medical Advice | Payer: Managed Care, Other (non HMO) | Source: Home / Self Care

## 2015-09-24 DIAGNOSIS — I10 Essential (primary) hypertension: Secondary | ICD-10-CM | POA: Diagnosis not present

## 2015-09-24 DIAGNOSIS — R197 Diarrhea, unspecified: Secondary | ICD-10-CM | POA: Insufficient documentation

## 2015-09-24 DIAGNOSIS — R112 Nausea with vomiting, unspecified: Secondary | ICD-10-CM | POA: Diagnosis not present

## 2015-09-24 DIAGNOSIS — R1030 Lower abdominal pain, unspecified: Secondary | ICD-10-CM | POA: Diagnosis present

## 2015-09-24 DIAGNOSIS — R109 Unspecified abdominal pain: Secondary | ICD-10-CM | POA: Insufficient documentation

## 2015-09-24 DIAGNOSIS — R1110 Vomiting, unspecified: Secondary | ICD-10-CM | POA: Insufficient documentation

## 2015-09-24 LAB — COMPREHENSIVE METABOLIC PANEL
ALBUMIN: 3.9 g/dL (ref 3.5–5.0)
ALK PHOS: 41 U/L (ref 38–126)
ALT: 19 U/L (ref 14–54)
ANION GAP: 8 (ref 5–15)
AST: 18 U/L (ref 15–41)
BUN: 15 mg/dL (ref 6–20)
CALCIUM: 9.4 mg/dL (ref 8.9–10.3)
CHLORIDE: 105 mmol/L (ref 101–111)
CO2: 27 mmol/L (ref 22–32)
Creatinine, Ser: 0.6 mg/dL (ref 0.44–1.00)
GFR calc non Af Amer: >60 mL/min (ref >60)
GLUCOSE: 100 mg/dL — AB (ref 65–99)
Potassium: 3.6 mmol/L (ref 3.5–5.1)
SODIUM: 140 mmol/L (ref 135–145)
Total Bilirubin: 0.6 mg/dL (ref 0.3–1.2)
Total Protein: 6.7 g/dL (ref 6.5–8.1)

## 2015-09-24 LAB — URINE MICROSCOPIC-ADD ON: RBC / HPF: NONE SEEN RBC/hpf (ref 0–5)

## 2015-09-24 LAB — URINALYSIS, ROUTINE W REFLEX MICROSCOPIC
BILIRUBIN URINE: NEGATIVE
GLUCOSE, UA: NEGATIVE mg/dL
HGB URINE DIPSTICK: NEGATIVE
Ketones, ur: NEGATIVE mg/dL
Nitrite: NEGATIVE
PH: 6.5 (ref 5.0–8.0)
Protein, ur: NEGATIVE mg/dL
SPECIFIC GRAVITY, URINE: 1.027 (ref 1.005–1.030)

## 2015-09-24 LAB — CBC
HEMATOCRIT: 38 % (ref 36.0–46.0)
HEMOGLOBIN: 12.6 g/dL (ref 12.0–15.0)
MCH: 28.3 pg (ref 26.0–34.0)
MCHC: 33.2 g/dL (ref 30.0–36.0)
MCV: 85.4 fL (ref 78.0–100.0)
Platelets: 266 10*3/uL (ref 150–400)
RBC: 4.45 MIL/uL (ref 3.87–5.11)
RDW: 13.8 % (ref 11.5–15.5)
WBC: 6.4 10*3/uL (ref 4.0–10.5)

## 2015-09-24 LAB — LIPASE, BLOOD: LIPASE: 23 U/L (ref 11–51)

## 2015-09-24 LAB — POC URINE PREG, ED: PREG TEST UR: NEGATIVE

## 2015-09-24 MED ORDER — DICYCLOMINE HCL 10 MG PO CAPS
20.0000 mg | ORAL_CAPSULE | Freq: Once | ORAL | Status: AC
  Administered 2015-09-24: 20 mg via ORAL
  Filled 2015-09-24: qty 2

## 2015-09-24 MED ORDER — ONDANSETRON 4 MG PO TBDP
4.0000 mg | ORAL_TABLET | Freq: Once | ORAL | Status: AC
  Administered 2015-09-24: 4 mg via ORAL
  Filled 2015-09-24: qty 1

## 2015-09-24 MED ORDER — AMLODIPINE BESYLATE 5 MG PO TABS
10.0000 mg | ORAL_TABLET | Freq: Once | ORAL | Status: AC
  Administered 2015-09-24: 10 mg via ORAL
  Filled 2015-09-24: qty 2

## 2015-09-24 MED ORDER — AMLODIPINE BESYLATE 10 MG PO TABS: 10.0000 mg | ORAL_TABLET | Freq: Every day | ORAL | Status: DC

## 2015-09-24 MED ORDER — ONDANSETRON 4 MG PO TBDP: 4.0000 mg | ORAL_TABLET | Freq: Three times a day (TID) | ORAL | Status: DC | PRN

## 2015-09-24 NOTE — ED Notes (Addendum)
Pt came back to ED, Put back in waiting.

## 2015-09-24 NOTE — Discharge Instructions (Signed)
Abdominal Pain, Adult Kristi Daniels, take zofran as needed for nausea.  Take amlodipine everyday for your high blood pressure.  If is very important for you to see a primary care doctor within 3 days for close follow up of your blood pressure.  If any symptoms worsen, come back to the ED immediately.  Thank you. Many things can cause belly (abdominal) pain. Most times, the belly pain is not dangerous. Many cases of belly pain can be watched and treated at home. HOME CARE  Do not take medicines that help you go poop (laxatives) unless told to by your doctor. Only take medicine as told by your doctor. Eat or drink as told by your doctor. Your doctor will tell you if you should be on a special diet. GET HELP IF: You do not know what is causing your belly pain. You have belly pain while you are sick to your stomach (nauseous) or have runny poop (diarrhea). You have pain while you pee or poop. Your belly pain wakes you up at night. You have belly pain that gets worse or better when you eat. You have belly pain that gets worse when you eat fatty foods. You have a fever. GET HELP RIGHT AWAY IF:  The pain does not go away within 2 hours. You keep throwing up (vomiting). The pain changes and is only in the right or left part of the belly. You have bloody or tarry looking poop. MAKE SURE YOU:  Understand these instructions. Will watch your condition. Will get help right away if you are not doing well or get worse.   This information is not intended to replace advice given to you by your health care provider. Make sure you discuss any questions you have with your health care provider.   Document Released: 03/28/2008 Document Revised: 10/31/2014 Document Reviewed: 06/19/2013 Elsevier Interactive Patient Education 2016 [employer] Hypertension Hypertension is another name for high blood pressure. High blood pressure forces your heart to work harder to pump blood. A blood pressure reading has two  numbers, which includes a higher number over a lower number (example: 110/72). HOME CARE   Have your blood pressure rechecked by your doctor.  Only take medicine as told by your doctor. Follow the directions carefully. The medicine does not work as well if you skip doses. Skipping doses also puts you at risk for problems.  Do not smoke.  Monitor your blood pressure at home as told by your doctor. GET HELP IF:  You think you are having a reaction to the medicine you are taking.  You have repeat headaches or feel dizzy.  You have puffiness (swelling) in your ankles.  You have trouble with your vision. GET HELP RIGHT AWAY IF:   You get a very bad headache and are confused.  You feel weak, numb, or faint.  You get chest or belly (abdominal) pain.  You throw up (vomit).  You cannot breathe very well. MAKE SURE YOU:   Understand these instructions.  Will watch your condition.  Will get help right away if you are not doing well or get worse.   This information is not intended to replace advice given to you by your health care provider. Make sure you discuss any questions you have with your health care provider.   Document Released: 03/28/2008 Document Revised: 10/15/2013 Document Reviewed: 08/02/2013 Elsevier Interactive Patient Education [employer] Elsevier Inc.

## 2015-09-24 NOTE — ED Notes (Signed)
Pt came to desk, asked about wait time. Registration informed pt of her wait time. Pt was not happy with response calling registration a B**, pt then walked out very angry. Pt moved to off the floor at this time.

## 2015-09-24 NOTE — ED Notes (Signed)
Pt states that she is unable to wait any longer, that she has to get home to her kids. States that she will try to come back later. Pt moved to off the floor.

## 2015-09-24 NOTE — ED Notes (Signed)
PT is here with abdominal pain, vomiting, and diarrhea about 3-4 days

## 2015-09-24 NOTE — ED Provider Notes (Signed)
CSN: [id number]   Arrival date & time 09/24/15 1826  History  By signing my name below, I, Bethel BornBritney McCollum, attest that this documentation has been prepared under the direction and in the presence of Tomasita CrumbleAdeleke Joscelyne Renville, MD. Electronically Signed: Bethel BornBritney McCollum, ED Scribe. 09/24/2015. 11:05 PM.  Chief Complaint  Patient presents with  . Abdominal Pain    HPI The history is provided by the patient. No language interpreter was used.   Kristi Daniels is a 39 y.o. female who presents to the Emergency Department complaining of new, constant, cramping, 8/10 in severity,  lower abdominal pain with onset 2 days ago. Associated symptoms include n/v/d. Pt denies dysuria, hematuria, hematochezia, abnormal vaginal bleeding or discharge. She has had no known sick contact or exposure to suspect food. She is not currently on an antihypertensive and is in the process of establishing primary care.   History reviewed. No pertinent past medical history.  Past Surgical History  Procedure Laterality Date  . Joint replacement      elbow     No family history on file.  Social History  Substance Use Topics  . Smoking status: Never Smoker   . Smokeless tobacco: Never Used  . Alcohol Use: No     Review of Systems 10 Systems reviewed and all are negative for acute change except as noted in the HPI. Home Medications   Prior to Admission medications   Medication Sig Start Date End Date Taking? Authorizing Provider  cyclobenzaprine (FLEXERIL) 5 MG tablet Take 2 tablets (10 mg total) by mouth 2 (two) times daily as needed for muscle spasms. 08/19/15  Yes Teressa LowerVrinda Pickering, NP    Allergies  Review of patient's allergies indicates no known allergies.  Triage Vitals: BP 195/118 mmHg  Pulse 81  Temp(Src) 98.3 F (36.8 C) (Oral)  Resp 16  SpO2 99%  Physical Exam  Constitutional: She is oriented to person, place, and time. She appears well-developed and well-nourished. No distress.  HENT:  Head:  Normocephalic and atraumatic.  Nose: Nose normal.  Mouth/Throat: Oropharynx is clear and moist. No oropharyngeal exudate.  Eyes: Conjunctivae and EOM are normal. Pupils are equal, round, and reactive to light. No scleral icterus.  Neck: Normal range of motion. Neck supple. No JVD present. No tracheal deviation present. No thyromegaly present.  Cardiovascular: Normal rate, regular rhythm and normal heart sounds.  Exam reveals no gallop and no friction rub.   No murmur heard. Pulmonary/Chest: Effort normal and breath sounds normal. No respiratory distress. She has no wheezes. She exhibits no tenderness.  Abdominal: Soft. Bowel sounds are normal. She exhibits no distension and no mass. There is no tenderness. There is no rebound and no guarding.  Musculoskeletal: Normal range of motion. She exhibits no edema or tenderness.  Lymphadenopathy:    She has no cervical adenopathy.  Neurological: She is alert and oriented to person, place, and time. No cranial nerve deficit. She exhibits normal muscle tone.  Skin: Skin is warm and dry. No rash noted. No erythema. No pallor.  Nursing note and vitals reviewed.   ED Course  Procedures   DIAGNOSTIC STUDIES: Oxygen Saturation is 99% on RA, normal by my interpretation.    COORDINATION OF CARE: 11:03 PM Discussed treatment plan which includes lab work, Zofran, Bentyl, and Norvasc with pt at bedside and pt agreed to plan.  Labs Reviewed - No data to display  Imaging Review No results found.  I personally reviewed and evaluated these lab results as a part of  my medical decision-making.   MDM   Final diagnoses:  None   Patient presents to the ED for evaluation of her abd pain, N/V/D.  She states she thinks her children brought something home.  All of her symptoms are now improving and abdominal exam is normal.  Will give bentyl and zofran.  She is also markedly hypertensive, will start amlodipine.  She has no evidence of end organ damage  currently.  She appears well and in NAD.  Zofran and amlodipine Rx given.  Patient is safe forDC.     I personally performed the services described in this documentation, which was scribed in my presence. The recorded information has been reviewed and is accurate.      Tomasita Crumble, MD 09/24/15 [phone]

## 2015-09-24 NOTE — ED Notes (Signed)
Pt here for lower abdominal pain, N/V/D onset 3-4 days ago. She left without being seen after triage earlier this morning. Blood work done earlier this morning.

## 2015-09-25 NOTE — ED Notes (Signed)
Pt left at this time with all belongings.  

## 2015-10-01 ENCOUNTER — Inpatient Hospital Stay: Payer: Managed Care, Other (non HMO) | Admitting: Family Medicine

## 2015-11-06 ENCOUNTER — Encounter (HOSPITAL_COMMUNITY): Payer: Self-pay | Admitting: Emergency Medicine

## 2015-11-06 ENCOUNTER — Emergency Department (HOSPITAL_COMMUNITY)
Admission: EM | Admit: 2015-11-06 | Discharge: 2015-11-06 | Disposition: A | Discharge status: Home / Self Care | Payer: Managed Care, Other (non HMO) | Attending: Emergency Medicine | Admitting: Emergency Medicine

## 2015-11-06 DIAGNOSIS — F17210 Nicotine dependence, cigarettes, uncomplicated: Secondary | ICD-10-CM | POA: Diagnosis not present

## 2015-11-06 DIAGNOSIS — Z79899 Other long term (current) drug therapy: Secondary | ICD-10-CM | POA: Insufficient documentation

## 2015-11-06 DIAGNOSIS — I10 Essential (primary) hypertension: Secondary | ICD-10-CM | POA: Diagnosis not present

## 2015-11-06 DIAGNOSIS — M25531 Pain in right wrist: Secondary | ICD-10-CM | POA: Diagnosis present

## 2015-11-06 HISTORY — DX: Essential (primary) hypertension: I10

## 2015-11-06 MED ORDER — KETOROLAC TROMETHAMINE 60 MG/2ML IM SOLN
60.0000 mg | Freq: Once | INTRAMUSCULAR | Status: AC
  Administered 2015-11-06: 60 mg via INTRAMUSCULAR
  Filled 2015-11-06: qty 2

## 2015-11-06 NOTE — Discharge Instructions (Signed)
Wrist Pain There are many things that can cause wrist pain. Some common causes include:  An injury to the wrist area, such as a sprain, strain, or fracture.  Overuse of the joint.  A condition that causes increased pressure on a nerve in the wrist (carpal tunnel syndrome).  Wear and tear of the joints that occurs with aging (osteoarthritis).  A variety of other types of arthritis. Sometimes, the cause of wrist pain is not known. The pain often goes away when you follow your health care provider's instructions for relieving pain at home. If your wrist pain continues, tests may need to be done to diagnose your condition. HOME CARE INSTRUCTIONS Pay attention to any changes in your symptoms. Take these actions to help with your pain:  Rest the wrist area for at least 48 hours or as told by your health care provider.  If directed, apply ice to the injured area:  Put ice in a plastic bag.  Place a towel between your skin and the bag.  Leave the ice on for 20 minutes, 2-3 times per day.  Keep your arm raised (elevated) above the level of your heart while you are sitting or lying down.  If a splint or elastic bandage has been applied, use it as told by your health care provider.  Remove the splint or bandage only as told by your health care provider.  Loosen the splint or bandage if your fingers become numb or have a tingling feeling, or if they turn cold or blue.  Take over-the-counter and prescription medicines only as told by your health care provider.  Keep all follow-up visits as told by your health care provider. This is important. SEEK MEDICAL CARE IF:  Your pain is not helped by treatment.  Your pain gets worse. SEEK IMMEDIATE MEDICAL CARE IF:  Your fingers become swollen.  Your fingers turn white, very red, or cold and blue.  Your fingers are numb or have a tingling feeling.  You have difficulty moving your fingers.   This information is not intended to replace  advice given to you by your health care provider. Make sure you discuss any questions you have with your health care provider.   Document Released: 07/20/2005 Document Revised: 07/01/2015 Document Reviewed: 02/25/2015 Elsevier Interactive Patient Education 2016 Elsevier Inc.  

## 2015-11-06 NOTE — ED Notes (Signed)
Patient states swelling and pain to R wrist.  Denies injury.   Patient denies other symptoms.

## 2015-11-06 NOTE — ED Notes (Signed)
Pt ambulated to treatment room with ease, pt given ice pack for right wrist.

## 2015-11-06 NOTE — ED Provider Notes (Signed)
CSN: [id number]     Arrival date & time 11/06/15  [id number] History   First MD Initiated Contact with Patient 11/06/15 [phone]     Chief Complaint  Patient presents with  . Wrist Pain   HPI  Ms. Kristi Daniels is a 40 year old female with a past medical history of hypertension presenting with wrist pain. She reports gradual onset of wrist pain yesterday at work. She works in in [employer] and does repetitive motions throughout her shift. She states that her right wrist became sore after her shift yesterday. She denies injury to the wrist. She states that she went home and noticed that her wrist appeared more swollen than the other. The pain is located about the radial aspect of her wrist and extends into the thenar eminence. She woke this morning with worsening pain. She denies numbness, tingling or loss of sensation in the hand. She has not tried any treatments prior to arrival. she describes the pain as a deep ache. The pain is exacerbated by movement. She has no other complaints today. She is requesting referral to primary care provider today.   Past Medical History  Diagnosis Date  . Hypertension    Past Surgical History  Procedure Laterality Date  . Joint replacement      elbow    No family history on file. Social History  Substance Use Topics  . Smoking status: Current Every Day Smoker -- 0.50 packs/day    Types: Cigarettes  . Smokeless tobacco: Never Used  . Alcohol Use: Yes     Comment: socially   OB History    No data available     Review of Systems  Musculoskeletal: Positive for arthralgias.  All other systems reviewed and are negative.     Allergies  Review of patient's allergies indicates no known allergies.  Home Medications   Prior to Admission medications   Medication Sig Start Date End Date Taking? Authorizing Provider  amLODipine (NORVASC) 10 MG tablet Take 1 tablet (10 mg total) by mouth daily. 09/24/15  Yes Tomasita CrumbleAdeleke Oni, MD  cyclobenzaprine (FLEXERIL) 5 MG  tablet Take 2 tablets (10 mg total) by mouth 2 (two) times daily as needed for muscle spasms. 08/19/15   Teressa LowerVrinda Pickering, NP  ondansetron (ZOFRAN-ODT) 4 MG disintegrating tablet Take 1 tablet (4 mg total) by mouth every 8 (eight) hours as needed for nausea or vomiting. 09/24/15   Tomasita CrumbleAdeleke Oni, MD   BP 127/79 mmHg  Pulse 90  Temp(Src) 98.2 F (36.8 C) (Oral)  Resp 18  Ht 5\' 2"  (1.575 m)  Wt 79.9 kg  BMI 32.21 kg/m2  SpO2 99%  LMP 10/25/2015 Physical Exam  Constitutional: She appears well-developed and well-nourished. No distress.  HENT:  Head: Normocephalic and atraumatic.  Eyes: Conjunctivae are normal. Right eye exhibits no discharge. Left eye exhibits no discharge. No scleral icterus.  Neck: Normal range of motion.  Cardiovascular: Normal rate, regular rhythm and intact distal pulses.   Radial pulse palpable. Cap refill < 3 seconds  Pulmonary/Chest: Effort normal. No respiratory distress.  Musculoskeletal:       Right wrist: She exhibits decreased range of motion and tenderness. She exhibits no swelling and no deformity.       Hands: Generalized tenderness of wrist and thenar eminence of right hand as indicated in diagram. Restricted active ROM of wrist secondary to pain. Full passive ROM though painful. FROM of right digits. Pt is able to make a fist. No edema or deformity of the wrist.  Negative tinels.   Neurological: She is alert. Coordination normal.  Pt refuses strength testing due to pain. Sensation to light touch intact over right wrist and hand.   Skin: Skin is warm and dry.  Psychiatric: She has a normal mood and affect. Her behavior is normal.  Nursing note and vitals reviewed.   ED Course  Procedures (including critical care time) Labs Review Labs Reviewed - No data to display  Imaging Review No results found. I have personally reviewed and evaluated these images and lab results as part of my medical decision-making.   EKG Interpretation None      MDM    Final diagnoses:  Wrist pain, right   Patient presenting with right wrist pain. Right hand is neurovascularly intact with FROM of digits and passive ROM of wrist. No hx of injury. Likely from overuse at her job. Pain managed in ED with toradol. Brace given and conservative therapy recommended. Discussed RICE therapy and use of OTC pain relievers. Pt advised to follow up with PCP if symptoms persist. Return precautions discussed at bedside and given in discharge paperwork. Pt is stable for discharge.      Rolm Gala Londen Lorge, PA-C 11/06/15 1610  Bethann Berkshire, MD 11/06/15 [phone]

## 2018-06-18 ENCOUNTER — Other Ambulatory Visit: Payer: Self-pay

## 2018-06-18 ENCOUNTER — Emergency Department (HOSPITAL_COMMUNITY)
Admission: EM | Admit: 2018-06-18 | Discharge: 2018-06-18 | Disposition: A | Discharge status: Home / Self Care | Payer: Managed Care, Other (non HMO) | Attending: Emergency Medicine | Admitting: Emergency Medicine

## 2018-06-18 ENCOUNTER — Encounter (HOSPITAL_COMMUNITY): Payer: Self-pay

## 2018-06-18 DIAGNOSIS — F17210 Nicotine dependence, cigarettes, uncomplicated: Secondary | ICD-10-CM | POA: Insufficient documentation

## 2018-06-18 DIAGNOSIS — M79645 Pain in left finger(s): Secondary | ICD-10-CM | POA: Insufficient documentation

## 2018-06-18 DIAGNOSIS — M79644 Pain in right finger(s): Secondary | ICD-10-CM | POA: Insufficient documentation

## 2018-06-18 DIAGNOSIS — I10 Essential (primary) hypertension: Secondary | ICD-10-CM | POA: Insufficient documentation

## 2018-06-18 DIAGNOSIS — M25511 Pain in right shoulder: Secondary | ICD-10-CM

## 2018-06-18 DIAGNOSIS — Z79899 Other long term (current) drug therapy: Secondary | ICD-10-CM | POA: Insufficient documentation

## 2018-06-18 MED ORDER — MELOXICAM 15 MG PO TABS: 15.0000 mg | ORAL_TABLET | Freq: Every day | ORAL | 0 refills | Status: AC

## 2018-06-18 NOTE — ED Provider Notes (Signed)
[facility] EMERGENCY DEPARTMENT Provider Note  CSN: [id number] Arrival date & time: 06/18/18  5621   History   Chief Complaint Chief Complaint  Patient presents with  . Joint Pain    HPI Kristi Daniels is a 42 y.o. female with a medical history of HTN who presented to the ED for right shoulder pain and stiffness in her thumbs bilaterally x 1 month. Patient describes aching pain in her right shoulder that is worse with movements. Denies any recent injury, traumas or falls, but states that her job requires her to do a lot of heavy lifting, pulling and pushing. She also describes stiffness in both of her thumbs and aching pain that is worse in the right vs left. Patient is right hand dominant. She states that sometimes her thumbs get stuck when she bends them. Denies fever, chills, skin rashes, warmth in joints or swelling. Denies other arthralgias, myalgias, paresthesias or weakness. Patient has tried nothing prior to coming to the ED.  Past Medical History:  Diagnosis Date  . Hypertension     There are no active problems to display for this patient.   Past Surgical History:  Procedure Laterality Date  . JOINT REPLACEMENT     elbow      OB History   None      Home Medications    Prior to Admission medications   Medication Sig Start Date End Date Taking? Authorizing Provider  amLODipine (NORVASC) 10 MG tablet Take 1 tablet (10 mg total) by mouth daily. 09/24/15   Tomasita Crumble, MD  cyclobenzaprine (FLEXERIL) 5 MG tablet Take 2 tablets (10 mg total) by mouth 2 (two) times daily as needed for muscle spasms. 08/19/15   Teressa Lower, NP  meloxicam (MOBIC) 15 MG tablet Take 1 tablet (15 mg total) by mouth daily. 06/18/18 07/18/18  Stanton Kissoon, Jerrel Ivory I, PA-C  ondansetron (ZOFRAN-ODT) 4 MG disintegrating tablet Take 1 tablet (4 mg total) by mouth every 8 (eight) hours as needed for nausea or vomiting. 09/24/15   Tomasita Crumble, MD    Family History History  reviewed. No pertinent family history.  Social History Social History   Tobacco Use  . Smoking status: Current Every Day Smoker    Packs/day: 0.50    Types: Cigarettes  . Smokeless tobacco: Never Used  Substance Use Topics  . Alcohol use: Yes    Comment: socially  . Drug use: No     Allergies   Patient has no known allergies.   Review of Systems Review of Systems  Constitutional: Negative for chills and fever.  Eyes: Negative.   Respiratory: Negative.   Cardiovascular: Negative.   Genitourinary: Negative.   Musculoskeletal: Positive for arthralgias. Negative for back pain, joint swelling and neck pain.  Skin: Negative.   Neurological: Negative for weakness and numbness.  Hematological: Negative.      Physical Exam Updated Vital Signs BP (!) 163/107 (BP Location: Right Arm)   Pulse 90   Temp 97.6 F (36.4 C) (Oral)   Resp 16   SpO2 97%   Physical Exam  Constitutional: She appears well-developed and well-nourished.  HENT:  Head: Normocephalic and atraumatic.  Eyes: Pupils are equal, round, and reactive to light. Conjunctivae, EOM and lids are normal.  Cardiovascular: Normal rate, regular rhythm, normal heart sounds and intact distal pulses.  Musculoskeletal:       Right shoulder: She exhibits tenderness. She exhibits normal range of motion and no bony tenderness.  Left shoulder: Normal.       Right elbow: Normal.      Left elbow: Normal.       Right wrist: Normal.       Left wrist: Normal.       Right hand: She exhibits decreased range of motion and tenderness. She exhibits no bony tenderness and no swelling. Decreased sensation noted. Normal strength noted.       Left hand: She exhibits normal range of motion, no tenderness, no bony tenderness, normal capillary refill and no swelling. Normal sensation noted. Normal strength noted.  Right Hand: + Finklestein's test of right hand and tenderness over MCP of 1 right first digit. No nodules or thickened  tendons appreciated in right hand. 5/5 strength. Pain with flexion. No bony tenderness. Right Shoulder: Full active and passive ROM with 5/5 strength. No deformities or bony tenderness. Endorsed pain with ROM. Remaining upper extremity joints normal.   Neurological: She has normal strength. No sensory deficit. She exhibits normal muscle tone.  Reflex Scores:      Tricep reflexes are 2+ on the right side and 2+ on the left side.      Bicep reflexes are 2+ on the right side and 2+ on the left side.      Brachioradialis reflexes are 2+ on the right side and 2+ on the left side. Skin: Skin is warm and intact. Capillary refill takes less than 2 seconds. No bruising and no ecchymosis noted. No erythema.  Nursing note and vitals reviewed.  ED Treatments / Results  Labs (all labs ordered are listed, but only abnormal results are displayed) Labs Reviewed - No data to display  EKG None  Radiology No results found.  Procedures Procedures (including critical care time)  Medications Ordered in ED Medications - No data to display   Initial Impression / Assessment and Plan / ED Course  Triage vital signs and the nursing notes have been reviewed.  Pertinent labs & imaging results that were available during care of the patient were reviewed and considered in medical decision making (see chart for details).  Patient is in no distress and well appearing. She presents with multiple MSK complaints of right shoulder and bilateral thumb pain. Patient has full sensation in upper extremities bilaterally. She  also has full active and passive ROM of the affected limbs, but reports pain with right shoulder movements and left thumb flexion. However, strength and reflexes are normal. No deformities, decreased muscle tone or other abnormalities on physical exam. Neurovascular function is intact. No recent trauma/injury or bony tenderness that would warrant imaging today.There are no other physical exam findings  or s/s that suggest an underlying infectious or rheumatologic process that warrant further evaluation or intervention today.  Clinical Course as of Jun 19 1023  Mon Jun 18, 2018  1009 Elevated BP in triage at 163/107. Patient diagnosed with HTN, but states she has not taken any antihypertensive in 1-2 years. Not currently established with PCP due to lack of insurance, but states she recently applied to Medicaid. No complaints or physical exam findings that suggest end organ damage. Patient encouraged to follow-up with PCP regarding HTN and medications.   [GM]    Clinical Course User Index [GM] Shravya Wickwire, Sharyon Medicus, PA-C     Final Clinical Impressions(s) / ED Diagnoses  1. Right Shoulder Pain. MSK strain. Rx for Mobic given as patient has reported significant improvement with that in the past. Education on other OTC and supportive treatments for pain  relief and inflammation. 2. Bilateral Thumb Pain. Likely early arthritis and tendonitis, especially in right hand. Rx for Mobic. Education on other OTC and supportive treatment. Advised to follow-up with PCP once established for MSK complaints. 3. Hypertension. Untreated for the last 1-2 years. No signs of end organ damage that warrant further evaluation today. Advised to follow-up with PCP.  Dispo: Home. After thorough clinical evaluation, this patient is determined to be medically stable and can be safely discharged with the previously mentioned treatment and/or outpatient follow-up/referral(s). At this time, there are no other apparent medical conditions that require further screening, evaluation or treatment.  Final diagnoses:  Acute pain of right shoulder  Bilateral thumb pain  Hypertension, unspecified type    ED Discharge Orders         Ordered    meloxicam (MOBIC) 15 MG tablet  Daily     06/18/18 1021            Dagoberto LigasMortis, Addysen Louth I, [place] JerseyPA-C 06/18/18 1024    Pricilla LovelessGoldston, Scott, MD 06/19/18 1153

## 2018-06-18 NOTE — ED Triage Notes (Signed)
Pt reports joint pain in her hands bilaterally and her right shoulder. She has thumb splint to right thumb and reports discomfort when bending.

## 2018-06-18 NOTE — Discharge Instructions (Addendum)
The pain in your shoulder is likely from muscle strain. The pain in your thumbs is tendonitis and the start of arthritis. I have written you a prescription for Meloxicam (Mobic) which works for pain relief and inflammation. I recommend that you take this daily for the best results. You may use this with Tylenol as well as warm compresses.  Your blood pressure was elevated too. It is important that you follow-up with a PCP once your Medicaid kicks in to get this addressed too. I have [facility] below, but you can follow-up with anyone of your choosing.

## 2019-03-23 ENCOUNTER — Other Ambulatory Visit: Payer: Self-pay

## 2019-03-23 ENCOUNTER — Encounter (HOSPITAL_COMMUNITY): Payer: Self-pay | Admitting: *Deleted

## 2019-03-23 ENCOUNTER — Emergency Department (HOSPITAL_COMMUNITY)
Admission: EM | Admit: 2019-03-23 | Discharge: 2019-03-23 | Disposition: A | Discharge status: Home / Self Care | Payer: Self-pay | Attending: Emergency Medicine | Admitting: Emergency Medicine

## 2019-03-23 DIAGNOSIS — F17210 Nicotine dependence, cigarettes, uncomplicated: Secondary | ICD-10-CM | POA: Insufficient documentation

## 2019-03-23 DIAGNOSIS — I10 Essential (primary) hypertension: Secondary | ICD-10-CM | POA: Insufficient documentation

## 2019-03-23 DIAGNOSIS — Z79899 Other long term (current) drug therapy: Secondary | ICD-10-CM | POA: Insufficient documentation

## 2019-03-23 DIAGNOSIS — Z96622 Presence of left artificial elbow joint: Secondary | ICD-10-CM | POA: Insufficient documentation

## 2019-03-23 DIAGNOSIS — M5432 Sciatica, left side: Secondary | ICD-10-CM | POA: Insufficient documentation

## 2019-03-23 DIAGNOSIS — M25552 Pain in left hip: Secondary | ICD-10-CM | POA: Insufficient documentation

## 2019-03-23 MED ORDER — MELOXICAM 7.5 MG PO TABS: 7.5000 mg | ORAL_TABLET | Freq: Every day | ORAL | 0 refills | Status: DC

## 2019-03-23 MED ORDER — DEXAMETHASONE SODIUM PHOSPHATE 10 MG/ML IJ SOLN
10.0000 mg | Freq: Once | INTRAMUSCULAR | Status: AC
Start: 2019-03-23 — End: 2019-03-23
  Administered 2019-03-23: 07:00:00 10 mg via INTRAMUSCULAR
  Filled 2019-03-23: qty 1

## 2019-03-23 MED ORDER — ACETAMINOPHEN 500 MG PO TABS: 500.0000 mg | ORAL_TABLET | Freq: Four times a day (QID) | ORAL | 0 refills | Status: DC | PRN

## 2019-03-23 MED ORDER — AMLODIPINE BESYLATE 10 MG PO TABS: 10.0000 mg | ORAL_TABLET | Freq: Every day | ORAL | 0 refills | Status: DC

## 2019-03-23 MED ORDER — METHOCARBAMOL 500 MG PO TABS: 500.0000 mg | ORAL_TABLET | Freq: Two times a day (BID) | ORAL | 0 refills | Status: DC

## 2019-03-23 NOTE — ED Triage Notes (Signed)
The pt is c/o lower back pain for 1-2 weeks with pain coming down her legs  No known injury  lmp last month 15th

## 2019-03-23 NOTE — Discharge Instructions (Signed)
Take Mobic daily as prescribed.  You can alternate with Tylenol as prescribed for your pain.  Take Robaxin twice daily as needed for muscle pain or spasms.  Do not drive or operate machinery while taking this medication.  Use ice 3-4 times daily alternating 20 minutes on, 20 minutes off.  Please follow-up and establish care with a primary care provider.  I recommend taking your blood pressure a few times a week and recording it.  Please follow-up with the primary care provider if your back pain is continuing.  If you continue to have irregular periods, I also recommend following up with an OB/GYN.

## 2019-03-23 NOTE — ED Provider Notes (Signed)
[facility] EMERGENCY DEPARTMENT Provider Note   CSN: [id number] Arrival date & time: 03/23/19  4650    History   Chief Complaint Chief Complaint  Patient presents with  . Back Pain    HPI Kristi Daniels is a 43 y.o. female with history of untreated hypertension who presents with a one-week history of left lower back and hip pain that radiates down her leg.  She has had associated numbness and tingling intermittently.  It stays in the back of her leg and does not radiate around at her knee.  She denies any saddle anesthesia, bowel/bladder incontinence, fevers, history of IVDU, cancer, recent procedure to back.  She has taken Tylenol PM without relief.  She denies any urinary symptoms, other than frequent urination, however she attributes this to drinking a lot of water throughout the day.  This is not new.  She has been getting irregular periods over the past few months and hot flashes.  She thinks she may be beginning menopause.  She has no concern of pregnancy.  She has tubal ligation.     HPI  Past Medical History:  Diagnosis Date  . Hypertension     There are no active problems to display for this patient.   Past Surgical History:  Procedure Laterality Date  . JOINT REPLACEMENT     elbow      OB History   No obstetric history on file.      Home Medications    Prior to Admission medications   Medication Sig Start Date End Date Taking? Authorizing Provider  acetaminophen (TYLENOL) 500 MG tablet Take 1 tablet (500 mg total) by mouth every 6 (six) hours as needed. 03/23/19   Lei Dower, Waylan Boga, PA-C  amLODipine (NORVASC) 10 MG tablet Take 1 tablet (10 mg total) by mouth daily. 03/23/19   Abigaile Rossie, Waylan Boga, PA-C  cyclobenzaprine (FLEXERIL) 5 MG tablet Take 2 tablets (10 mg total) by mouth 2 (two) times daily as needed for muscle spasms. 08/19/15   Teressa Lower, NP  meloxicam (MOBIC) 7.5 MG tablet Take 1 tablet (7.5 mg total) by mouth daily. 03/23/19    Scot Shiraishi, Waylan Boga, PA-C  methocarbamol (ROBAXIN) 500 MG tablet Take 1 tablet (500 mg total) by mouth 2 (two) times daily. 03/23/19   Yonas Bunda, Waylan Boga, PA-C  ondansetron (ZOFRAN-ODT) 4 MG disintegrating tablet Take 1 tablet (4 mg total) by mouth every 8 (eight) hours as needed for nausea or vomiting. 09/24/15   Tomasita Crumble, MD    Family History No family history on file.  Social History Social History   Tobacco Use  . Smoking status: Current Every Day Smoker    Packs/day: 0.50    Types: Cigarettes  . Smokeless tobacco: Never Used  Substance Use Topics  . Alcohol use: Yes    Comment: socially  . Drug use: No     Allergies   Patient has no known allergies.   Review of Systems Review of Systems  Constitutional: Negative for fever.  Musculoskeletal: Positive for back pain.  Neurological: Positive for numbness.     Physical Exam Updated Vital Signs BP (!) 162/103 (BP Location: Right Arm)   Pulse 80   Temp 98.5 F (36.9 C) (Oral)   Resp 16   Ht 5\' 2"  (1.575 m)   Wt 92.1 kg   LMP 02/13/2019   SpO2 100%   BMI 37.13 kg/m   Physical Exam Vitals signs and nursing note reviewed.  Constitutional:  General: She is not in acute distress.    Appearance: She is well-developed. She is not diaphoretic.  HENT:     Head: Normocephalic and atraumatic.     Mouth/Throat:     Pharynx: No oropharyngeal exudate.  Eyes:     General: No scleral icterus.       Right eye: No discharge.        Left eye: No discharge.     Conjunctiva/sclera: Conjunctivae normal.     Pupils: Pupils are equal, round, and reactive to light.  Neck:     Musculoskeletal: Normal range of motion and neck supple.     Thyroid: No thyromegaly.  Cardiovascular:     Rate and Rhythm: Normal rate and regular rhythm.     Heart sounds: Normal heart sounds. No murmur. No friction rub. No gallop.   Pulmonary:     Effort: Pulmonary effort is normal. No respiratory distress.     Breath sounds: Normal breath  sounds. No stridor. No wheezing or rales.  Abdominal:     General: Bowel sounds are normal. There is no distension.     Palpations: Abdomen is soft.     Tenderness: There is no abdominal tenderness. There is no guarding or rebound.  Musculoskeletal:       Legs:     Comments: No midline cervical, thoracic, or lumbar tenderness 5/5 strength and sensation intact to all 4 extremities; 2+ patellar reflexes  Lymphadenopathy:     Cervical: No cervical adenopathy.  Skin:    General: Skin is warm and dry.     Coloration: Skin is not pale.     Findings: No rash.  Neurological:     Mental Status: She is alert.     Coordination: Coordination normal.      ED Treatments / Results  Labs (all labs ordered are listed, but only abnormal results are displayed) Labs Reviewed - No data to display  EKG None  Radiology No results found.  Procedures Procedures (including critical care time)  Medications Ordered in ED Medications  dexamethasone (DECADRON) injection 10 mg (10 mg Intramuscular Given 03/23/19 0726)     Initial Impression / Assessment and Plan / ED Course  I have reviewed the triage vital signs and the nursing notes.  Pertinent labs & imaging results that were available during my care of the patient were reviewed by me and considered in my medical decision making (see chart for details).        Patient presenting with back pain, most likely sciatica.  There are no red flags.  Suspect related to patient's job as she does do some heavy lifting and repetitive motion.  Patient given single dose Decadron and will discharge home with Mobic, Tylenol, Robaxin.  Ice and stretching/exercises discussed.  Patient will pressure elevated and she has been told to take blood pressure medication in the past, however does not have a PCP right now.  Will resume Norvasc.  Patient given information to establish care with a PCP.  I have also given information to follow-up with the OB/GYN concerning  her irregular periods.  She is adamant that she is not pregnant and does not want to do a urine sample to make sure.  Return precautions discussed.  Patient understands and agrees with plan.  Patient discharged in satisfactory condition.  Final Clinical Impressions(s) / ED Diagnoses   Final diagnoses:  Sciatica of left side    ED Discharge Orders         Ordered  amLODipine (NORVASC) 10 MG tablet  Daily,   Status:  Discontinued     03/23/19 0730    meloxicam (MOBIC) 7.5 MG tablet  Daily     03/23/19 0730    methocarbamol (ROBAXIN) 500 MG tablet  2 times daily     03/23/19 0730    acetaminophen (TYLENOL) 500 MG tablet  Every 6 hours PRN     03/23/19 0731    amLODipine (NORVASC) 10 MG tablet  Daily     03/23/19 0731           Emi Holes, PA-C 03/23/19 1610    Derwood Kaplan, MD 03/23/19 0830

## 2019-03-23 NOTE — ED Notes (Signed)
Pt discharged with all belongings. Discharge instructions reviewed with pt. Pt verbalized understanding. Opportunity for questions provided. Pt verbalized pts husband would be picking her up.

## 2019-05-02 ENCOUNTER — Inpatient Hospital Stay: Payer: Self-pay | Admitting: Family Medicine

## 2019-06-02 ENCOUNTER — Emergency Department (HOSPITAL_COMMUNITY): Payer: Medicaid Other

## 2019-06-02 ENCOUNTER — Emergency Department (HOSPITAL_COMMUNITY)
Admission: EM | Admit: 2019-06-02 | Discharge: 2019-06-03 | Disposition: A | Discharge status: Home / Self Care | Payer: Medicaid Other | Attending: Emergency Medicine | Admitting: Emergency Medicine

## 2019-06-02 ENCOUNTER — Encounter (HOSPITAL_COMMUNITY): Payer: Self-pay | Admitting: Emergency Medicine

## 2019-06-02 ENCOUNTER — Other Ambulatory Visit: Payer: Self-pay

## 2019-06-02 DIAGNOSIS — I10 Essential (primary) hypertension: Secondary | ICD-10-CM | POA: Insufficient documentation

## 2019-06-02 DIAGNOSIS — R0602 Shortness of breath: Secondary | ICD-10-CM | POA: Insufficient documentation

## 2019-06-02 DIAGNOSIS — F17210 Nicotine dependence, cigarettes, uncomplicated: Secondary | ICD-10-CM | POA: Insufficient documentation

## 2019-06-02 DIAGNOSIS — Z79899 Other long term (current) drug therapy: Secondary | ICD-10-CM | POA: Insufficient documentation

## 2019-06-02 DIAGNOSIS — M7918 Myalgia, other site: Secondary | ICD-10-CM | POA: Diagnosis not present

## 2019-06-02 DIAGNOSIS — R112 Nausea with vomiting, unspecified: Secondary | ICD-10-CM | POA: Diagnosis not present

## 2019-06-02 DIAGNOSIS — R0789 Other chest pain: Secondary | ICD-10-CM | POA: Diagnosis not present

## 2019-06-02 DIAGNOSIS — Z20828 Contact with and (suspected) exposure to other viral communicable diseases: Secondary | ICD-10-CM | POA: Diagnosis not present

## 2019-06-02 DIAGNOSIS — R05 Cough: Secondary | ICD-10-CM | POA: Insufficient documentation

## 2019-06-02 DIAGNOSIS — N12 Tubulo-interstitial nephritis, not specified as acute or chronic: Secondary | ICD-10-CM | POA: Diagnosis not present

## 2019-06-02 DIAGNOSIS — R439 Unspecified disturbances of smell and taste: Secondary | ICD-10-CM | POA: Insufficient documentation

## 2019-06-02 DIAGNOSIS — R509 Fever, unspecified: Secondary | ICD-10-CM | POA: Diagnosis present

## 2019-06-02 LAB — URINALYSIS, ROUTINE W REFLEX MICROSCOPIC
Bilirubin Urine: NEGATIVE
Glucose, UA: NEGATIVE mg/dL
Ketones, ur: NEGATIVE mg/dL
Nitrite: POSITIVE — AB
Protein, ur: 100 mg/dL — AB
Specific Gravity, Urine: 1.021 (ref 1.005–1.030)
WBC, UA: >50 WBC/hpf — ABNORMAL HIGH (ref 0–5)
pH: 5 (ref 5.0–8.0)

## 2019-06-02 LAB — SARS CORONAVIRUS 2 BY RT PCR (HOSPITAL ORDER, PERFORMED IN ~~LOC~~ HOSPITAL LAB): SARS Coronavirus 2: NEGATIVE

## 2019-06-02 LAB — CBC WITH DIFFERENTIAL/PLATELET
Abs Immature Granulocytes: 0.03 10*3/uL (ref 0.00–0.07)
Basophils Absolute: 0 10*3/uL (ref 0.0–0.1)
Basophils Relative: 0 %
Eosinophils Absolute: 0 10*3/uL (ref 0.0–0.5)
Eosinophils Relative: 0 %
HCT: 39 % (ref 36.0–46.0)
Hemoglobin: 12.6 g/dL (ref 12.0–15.0)
Immature Granulocytes: 0 %
Lymphocytes Relative: 18 %
Lymphs Abs: 1.8 10*3/uL (ref 0.7–4.0)
MCH: 25.4 pg — ABNORMAL LOW (ref 26.0–34.0)
MCHC: 32.3 g/dL (ref 30.0–36.0)
MCV: 78.5 fL — ABNORMAL LOW (ref 80.0–100.0)
Monocytes Absolute: 0.9 10*3/uL (ref 0.1–1.0)
Monocytes Relative: 9 %
Neutro Abs: 7.6 10*3/uL (ref 1.7–7.7)
Neutrophils Relative %: 73 %
Platelets: 239 10*3/uL (ref 150–400)
RBC: 4.97 MIL/uL (ref 3.87–5.11)
RDW: 15.5 % (ref 11.5–15.5)
WBC: 10.4 10*3/uL (ref 4.0–10.5)
nRBC: 0 % (ref 0.0–0.2)

## 2019-06-02 LAB — PROTIME-INR
INR: 1.2 (ref 0.8–1.2)
Prothrombin Time: 15.5 seconds — ABNORMAL HIGH (ref 11.4–15.2)

## 2019-06-02 LAB — COMPREHENSIVE METABOLIC PANEL
ALT: 26 U/L (ref 0–44)
AST: 23 U/L (ref 15–41)
Albumin: 3.6 g/dL (ref 3.5–5.0)
Alkaline Phosphatase: 47 U/L (ref 38–126)
Anion gap: 14 (ref 5–15)
BUN: 13 mg/dL (ref 6–20)
CO2: 27 mmol/L (ref 22–32)
Calcium: 8.9 mg/dL (ref 8.9–10.3)
Chloride: 96 mmol/L — ABNORMAL LOW (ref 98–111)
Creatinine, Ser: 1.04 mg/dL — ABNORMAL HIGH (ref 0.44–1.00)
GFR calc Af Amer: >60 mL/min (ref >60)
GFR calc non Af Amer: >60 mL/min (ref >60)
Glucose, Bld: 110 mg/dL — ABNORMAL HIGH (ref 70–99)
Potassium: 3 mmol/L — ABNORMAL LOW (ref 3.5–5.1)
Sodium: 137 mmol/L (ref 135–145)
Total Bilirubin: 0.6 mg/dL (ref 0.3–1.2)
Total Protein: 7.4 g/dL (ref 6.5–8.1)

## 2019-06-02 LAB — I-STAT BETA HCG BLOOD, ED (MC, WL, AP ONLY): I-stat hCG, quantitative: <5 m[IU]/mL (ref <5)

## 2019-06-02 LAB — APTT: aPTT: 33 seconds (ref 24–36)

## 2019-06-02 LAB — LACTIC ACID, PLASMA: Lactic Acid, Venous: 1.4 mmol/L (ref 0.5–1.9)

## 2019-06-02 LAB — TROPONIN I (HIGH SENSITIVITY): Troponin I (High Sensitivity): 9 ng/L (ref <18)

## 2019-06-02 MED ORDER — SODIUM CHLORIDE 0.9 % IV BOLUS
1000.0000 mL | Freq: Once | INTRAVENOUS | Status: AC
  Administered 2019-06-02: 1000 mL via INTRAVENOUS

## 2019-06-02 MED ORDER — ACETAMINOPHEN 325 MG PO TABS
650.0000 mg | ORAL_TABLET | Freq: Once | ORAL | Status: AC | PRN
  Administered 2019-06-02: 650 mg via ORAL
  Filled 2019-06-02: qty 2

## 2019-06-02 MED ORDER — ONDANSETRON HCL 4 MG/2ML IJ SOLN
4.0000 mg | Freq: Once | INTRAMUSCULAR | Status: AC
  Administered 2019-06-02: 22:00:00 4 mg via INTRAVENOUS
  Filled 2019-06-02: qty 2

## 2019-06-02 MED ORDER — SODIUM CHLORIDE 0.9 % IV SOLN
1.0000 g | Freq: Once | INTRAVENOUS | Status: AC
  Administered 2019-06-03: 1 g via INTRAVENOUS
  Filled 2019-06-02: qty 10

## 2019-06-02 MED ORDER — POTASSIUM CHLORIDE CRYS ER 20 MEQ PO TBCR
40.0000 meq | EXTENDED_RELEASE_TABLET | Freq: Once | ORAL | Status: AC
  Administered 2019-06-03: 40 meq via ORAL
  Filled 2019-06-02: qty 2

## 2019-06-02 NOTE — ED Notes (Signed)
Set up bsc for urine.

## 2019-06-02 NOTE — ED Notes (Signed)
Gave pt gingerale for PO challenge 

## 2019-06-02 NOTE — ED Provider Notes (Signed)
[facility] Provider Note   CSN: [id number] Arrival date & time: 06/02/19  1813    History   Chief Complaint Chief Complaint  Patient presents with  . Fever    HPI Heather RobertsYolanda Y Portocarrero is a 43 y.o. female.     The history is provided by the patient and medical records. No language interpreter was used.  Fever  Heather RobertsYolanda Y Giza is a 43 y.o. female who presents to the Emergency Department complaining of fever. She presents the emergency department for complaint of fever, body aches for the last three days. She complained of nausea, vomiting and alteration in her taste. She has mild cough and shortness of breath with mild chest discomfort. 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 She has a history of hypertension but does not taken medications. Symptoms are moderate, constant, worsening. Past Medical History:  Diagnosis Date  . Hypertension     There are no active problems to display for this patient.   Past Surgical History:  Procedure Laterality Date  . JOINT REPLACEMENT     elbow      OB History   No obstetric history on file.      Home Medications    Prior to Admission medications   Medication Sig Start Date End Date Taking? Authorizing Provider  amLODipine (NORVASC) 10 MG tablet Take 1 tablet (10 mg total) by mouth daily. 03/23/19  Yes Law, Waylan BogaAlexandra M, PA-C  meloxicam (MOBIC) 7.5 MG tablet Take 1 tablet (7.5 mg total) by mouth daily. 03/23/19  Yes Law, Waylan BogaAlexandra M, PA-C  methocarbamol (ROBAXIN) 500 MG tablet Take 1 tablet (500 mg total) by mouth 2 (two) times daily. 03/23/19  Yes Law, Waylan BogaAlexandra M, PA-C  acetaminophen (TYLENOL) 500 MG tablet Take 1 tablet (500 mg total) by mouth every 6 (six) hours as needed. Patient not taking: Reported on 06/02/2019 03/23/19   Emi HolesLaw, Alexandra M, PA-C  cephALEXin (KEFLEX) 500 MG capsule Take 1 capsule (500 mg total) by mouth 3 (three) times daily. 06/03/19   Tilden Fossaees,  Symphony Demuro, MD  cyclobenzaprine (FLEXERIL) 5 MG tablet Take 2 tablets (10 mg total) by mouth 2 (two) times daily as needed for muscle spasms. Patient not taking: Reported on 06/02/2019 08/19/15   Teressa LowerPickering, Vrinda, NP  ondansetron (ZOFRAN-ODT) 4 MG disintegrating tablet Take 1 tablet (4 mg total) by mouth every 8 (eight) hours as needed for nausea or vomiting. Patient not taking: Reported on 06/02/2019 09/24/15   Tomasita Crumbleni, Adeleke, MD  promethazine (PHENERGAN) 25 MG tablet Take 1 tablet (25 mg total) by mouth every 8 (eight) hours as needed for nausea or vomiting. 06/03/19   Tilden Fossaees, Aisa Schoeppner, MD    Family History No family history on file.  Social History Social History   Tobacco Use  . Smoking status: Current Every Day Smoker    Packs/day: 0.50    Types: Cigarettes  . Smokeless tobacco: Never Used  Substance Use Topics  . Alcohol use: Yes    Comment: socially  . Drug use: No     Allergies   Patient has no known allergies.   Review of Systems Review of Systems  Constitutional: Positive for fever.  All other systems reviewed and are negative.    Physical Exam Updated Vital Signs BP (!) 141/84 (BP Location: Right Arm)   Pulse 99   Temp (!) 103 F (39.4 C) (Oral)   Resp 20   Ht 5\' 2"  (1.575 m)   Wt  96.2 kg   SpO2 98%   BMI 38.78 kg/m   Physical Exam Vitals signs and nursing note reviewed.  Constitutional:      Appearance: She is well-developed.  HENT:     Head: Normocephalic and atraumatic.  Cardiovascular:     Rate and Rhythm: Regular rhythm. Tachycardia present.     Heart sounds: No murmur.  Pulmonary:     Effort: Pulmonary effort is normal. No respiratory distress.     Breath sounds: Normal breath sounds.  Abdominal:     Palpations: Abdomen is soft.     Tenderness: There is no guarding or rebound.  Musculoskeletal:        General: No swelling or tenderness.  Skin:    General: Skin is warm and dry.  Neurological:     Mental Status: She is alert and oriented to  person, place, and time.  Psychiatric:        Behavior: Behavior normal.      ED Treatments / Results  Labs (all labs ordered are listed, but only abnormal results are displayed) Labs Reviewed  COMPREHENSIVE METABOLIC PANEL - Abnormal; Notable for the following components:      Result Value   Potassium 3.0 (*)    Chloride 96 (*)    Glucose, Bld 110 (*)    Creatinine, Ser 1.04 (*)    All other components within normal limits  CBC WITH DIFFERENTIAL/PLATELET - Abnormal; Notable for the following components:   MCV 78.5 (*)    MCH 25.4 (*)    All other components within normal limits  PROTIME-INR - Abnormal; Notable for the following components:   Prothrombin Time 15.5 (*)    All other components within normal limits  URINALYSIS, ROUTINE W REFLEX MICROSCOPIC - Abnormal; Notable for the following components:   APPearance CLOUDY (*)    Hgb urine dipstick SMALL (*)    Protein, ur 100 (*)    Nitrite POSITIVE (*)    Leukocytes,Ua LARGE (*)    WBC, UA >50 (*)    Bacteria, UA MANY (*)    Non Squamous Epithelial 0-5 (*)    All other components within normal limits  SARS CORONAVIRUS 2 (HOSPITAL ORDER, PERFORMED IN [facility])  URINE CULTURE  LACTIC ACID, PLASMA  APTT  LACTIC ACID, PLASMA  I-STAT BETA HCG BLOOD, ED ([facility], [facility], [facility] ONLY)  TROPONIN I (HIGH SENSITIVITY)  TROPONIN I (HIGH SENSITIVITY)    EKG None  Radiology Dg Chest Port 1 View  Result Date: 06/02/2019 CLINICAL DATA:  Fever, body aches, shortness of breath EXAM: PORTABLE CHEST 1 VIEW COMPARISON:  None. FINDINGS: Lungs are clear.  No pleural effusion or pneumothorax. The heart is normal in size. IMPRESSION: No evidence of acute cardiopulmonary disease. Electronically Signed   By: Charline BillsSriyesh  Krishnan M.D.   On: 06/02/2019 21:28    Procedures Procedures (including critical care time)  Medications Ordered in ED Medications  potassium chloride SA (K-DUR) CR tablet 40 mEq (has no administration in time range)   cefTRIAXone (ROCEPHIN) 1 g in sodium chloride 0.9 % 100 mL IVPB (has no administration in time range)  acetaminophen (TYLENOL) tablet 650 mg (650 mg Oral Given 06/02/19 1847)  sodium chloride 0.9 % bolus 1,000 mL (1,000 mLs Intravenous New Bag/Given 06/02/19 2225)  ondansetron (ZOFRAN) injection 4 mg (4 mg Intravenous Given 06/02/19 2209)     Initial Impression / Assessment and Plan / ED Course  I have reviewed the triage vital signs and the nursing notes.  Pertinent labs & imaging results that were available during my care of the patient were reviewed by me and considered in my medical decision making (see chart for details).        Patient here for evaluation of fever, body aches and vomiting for the last three days. She is febrile and tachycardic on initial evaluation but non-toxic appearing. She was treated with IV fluid hydration, antipyretic's. UA is concerning for urinary tract infection. She was treated with antibiotics. Abdominal examination is benign. Presentation is not consistent with acute appendicitis, infected kidney stone, urosepsis. On recheck after fluids and antiemetics she is feeling improved, able to tolerate oral fluids. Discussed with patient findings of studies. Discussed home care for pyelonephritis. Discussed outpatient follow-up as well as close return precautions.  Final Clinical Impressions(s) / ED Diagnoses   Final diagnoses:  Pyelonephritis    ED Discharge Orders         Ordered    cephALEXin (KEFLEX) 500 MG capsule  3 times daily     06/03/19 0004    promethazine (PHENERGAN) 25 MG tablet  Every 8 hours PRN     06/03/19 0004           Quintella Reichert, MD 06/03/19 [phone]

## 2019-06-02 NOTE — ED Triage Notes (Signed)
Pt c/o generalized body aches and fever x 3 days. 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 Temp 102.8 in triage.

## 2019-06-03 MED ORDER — CEPHALEXIN 500 MG PO CAPS: 500.0000 mg | ORAL_CAPSULE | Freq: Three times a day (TID) | ORAL | 0 refills | Status: DC

## 2019-06-03 MED ORDER — PROMETHAZINE HCL 25 MG PO TABS: 25.0000 mg | ORAL_TABLET | Freq: Three times a day (TID) | ORAL | 0 refills | Status: DC | PRN

## 2019-06-05 ENCOUNTER — Telehealth: Payer: Self-pay | Admitting: Emergency Medicine

## 2019-06-05 LAB — URINE CULTURE: Culture: >=100000 — AB

## 2019-06-05 NOTE — Telephone Encounter (Signed)
Post ED Visit - Positive Culture Follow-up  Culture report reviewed by antimicrobial stewardship pharmacist: [place] Team []  Elenor Quinones, Pharm.D. []  Heide Guile, Pharm.D., BCPS AQ-ID []  Parks Neptune, Pharm.D., BCPS []  Alycia Rossetti, Pharm.D., BCPS []  [place], [place].D., BCPS, AAHIVP []  Legrand Como, Pharm.D., BCPS, AAHIVP []  Salome Arnt, PharmD, BCPS []  Johnnette Gourd, PharmD, BCPS []  Hughes Better, PharmD, BCPS []  Leeroy Cha, PharmD []  Laqueta Linden, PharmD, BCPS []  Albertina Parr, PharmD Eddie Candle PharmD  [place] Team []  Leodis Sias, PharmD []  Lindell Spar, PharmD []  Royetta Asal, PharmD []  Graylin Shiver, Rph []  Rema Fendt) Glennon Mac, PharmD []  Arlyn Dunning, PharmD []  Netta Cedars, PharmD []  Dia Sitter, PharmD []  Leone Haven, PharmD []  Gretta Arab, PharmD []  Theodis Shove, PharmD []  Peggyann Juba, PharmD []  Reuel Boom, PharmD   Positive urine culture Treated with cephalexin, organism sensitive to the same and no further patient follow-up is required at this time.  Hazle Nordmann 06/05/2019, 10:57 AM

## 2019-06-05 NOTE — Telephone Encounter (Signed)
Post ED Visit - Positive Culture Follow-up  Culture report reviewed by antimicrobial stewardship pharmacist: [place] Team []  Elenor Quinones, Pharm.D. []  Heide Guile, Pharm.D., BCPS AQ-ID []  Parks Neptune, Pharm.D., BCPS []  Alycia Rossetti, Pharm.D., BCPS []  [place], Pharm.D., BCPS, AAHIVP []  Legrand Como, Pharm.D., BCPS, AAHIVP []  Salome Arnt, PharmD, BCPS []  Johnnette Gourd, PharmD, BCPS []  Hughes Better, PharmD, BCPS []  Leeroy Cha, PharmD []  Laqueta Linden, PharmD, BCPS []  Albertina Parr, PharmD Eddie Candle PharmD  [place] Team []  Leodis Sias, PharmD []  Lindell Spar, PharmD []  Royetta Asal, PharmD []  Graylin Shiver, Rph []  Rema Fendt) Glennon Mac, PharmD []  Arlyn Dunning, PharmD []  Netta Cedars, PharmD []  Dia Sitter, PharmD []  Leone Haven, PharmD []  Gretta Arab, PharmD []  Theodis Shove, PharmD []  Peggyann Juba, PharmD []  Reuel Boom, PharmD   Positive urine culture Treated with cephalexin, organism sensitive to the same and no further patient follow-up is required at this time.  Hazle Nordmann 06/05/2019, 11:20 AM

## 2019-06-06 ENCOUNTER — Telehealth: Payer: Self-pay

## 2019-06-06 NOTE — Telephone Encounter (Signed)
Post ED Visit - Positive Culture Follow-up  Culture report reviewed by antimicrobial stewardship pharmacist: [place] Team []  Elenor Quinones, Pharm.D. []  Heide Guile, Pharm.D., BCPS AQ-ID []  Parks Neptune, Pharm.D., BCPS []  Alycia Rossetti, Pharm.D., BCPS []  [place], Pharm.D., BCPS, AAHIVP []  Legrand Como, Pharm.D., BCPS, AAHIVP []  Salome Arnt, PharmD, BCPS []  Johnnette Gourd, PharmD, BCPS []  Hughes Better, PharmD, BCPS []  Leeroy Cha, PharmD []  Laqueta Linden, PharmD, BCPS []  Albertina Parr, PharmD [place] Team []  Leodis Sias, PharmD []  Lindell Spar, PharmD []  Royetta Asal, PharmD []  Graylin Shiver, Rph []  Rema Fendt) Glennon Mac, PharmD []  Arlyn Dunning, PharmD []  Netta Cedars, PharmD []  Dia Sitter, PharmD []  Leone Haven, PharmD []  Gretta Arab, PharmD []  Theodis Shove, PharmD []  Peggyann Juba, PharmD []  Reuel Boom, PharmD   Positive urine culture Treated with Cephalexin, organism sensitive to the same and no further patient follow-up is required at this time.  Genia Del 06/06/2019, 7:56 AM

## 2019-09-04 ENCOUNTER — Ambulatory Visit: Payer: Self-pay | Admitting: Family Medicine

## 2019-09-12 ENCOUNTER — Encounter: Payer: Self-pay | Admitting: Family Medicine

## 2020-03-26 ENCOUNTER — Ambulatory Visit (HOSPITAL_COMMUNITY)
Admission: EM | Admit: 2020-03-26 | Discharge: 2020-03-26 | Disposition: A | Discharge status: Home / Self Care | Payer: Medicaid Other | Attending: Family Medicine | Admitting: Family Medicine

## 2020-03-26 ENCOUNTER — Other Ambulatory Visit: Payer: Self-pay

## 2020-03-26 ENCOUNTER — Encounter (HOSPITAL_COMMUNITY): Payer: Self-pay | Admitting: Family Medicine

## 2020-03-26 DIAGNOSIS — I10 Essential (primary) hypertension: Secondary | ICD-10-CM

## 2020-03-26 DIAGNOSIS — M5431 Sciatica, right side: Secondary | ICD-10-CM

## 2020-03-26 DIAGNOSIS — R530 Neoplastic (malignant) related fatigue: Secondary | ICD-10-CM | POA: Diagnosis not present

## 2020-03-26 DIAGNOSIS — M25511 Pain in right shoulder: Secondary | ICD-10-CM

## 2020-03-26 DIAGNOSIS — M5432 Sciatica, left side: Secondary | ICD-10-CM

## 2020-03-26 DIAGNOSIS — R350 Frequency of micturition: Secondary | ICD-10-CM

## 2020-03-26 LAB — POCT URINALYSIS DIP (DEVICE)
Bilirubin Urine: NEGATIVE
Glucose, UA: NEGATIVE mg/dL
Hgb urine dipstick: NEGATIVE
Ketones, ur: NEGATIVE mg/dL
Leukocytes,Ua: NEGATIVE
Nitrite: NEGATIVE
Protein, ur: 100 mg/dL — AB
Specific Gravity, Urine: >=1.03 (ref 1.005–1.030)
Urobilinogen, UA: 0.2 mg/dL (ref 0.0–1.0)
pH: 5.5 (ref 5.0–8.0)

## 2020-03-26 MED ORDER — MELOXICAM 7.5 MG PO TABS: 7.5000 mg | ORAL_TABLET | Freq: Every day | ORAL | 0 refills | Status: DC

## 2020-03-26 MED ORDER — METHYLPREDNISOLONE SODIUM SUCC 125 MG IJ SOLR
INTRAMUSCULAR | Status: AC
  Filled 2020-03-26: qty 2

## 2020-03-26 MED ORDER — PREDNISONE 10 MG (21) PO TBPK: ORAL_TABLET | ORAL | 0 refills | Status: DC

## 2020-03-26 MED ORDER — METHYLPREDNISOLONE SODIUM SUCC 125 MG IJ SOLR
80.0000 mg | Freq: Once | INTRAMUSCULAR | Status: AC
  Administered 2020-03-26: 80 mg via INTRAMUSCULAR

## 2020-03-26 MED ORDER — AMLODIPINE BESYLATE 10 MG PO TABS: 10.0000 mg | ORAL_TABLET | Freq: Every day | ORAL | 0 refills | Status: AC

## 2020-03-26 MED ORDER — METHOCARBAMOL 500 MG PO TABS: 500.0000 mg | ORAL_TABLET | Freq: Two times a day (BID) | ORAL | 0 refills | Status: DC

## 2020-03-26 NOTE — ED Triage Notes (Signed)
Pt c/o urinary pressure, urgency, frequency for approx 2 weeks and states she has been using AZO w/o improvement. Also c/o nausea and abdominal pain; sacral/lumbar pain bilaterally right worse than left and reports pain travels down buttocks and posterior leg.  Also reports pain to right shoulder and night sweats.  Denies fever, chills, v/d. Last dose of tylenol was last night. Pt states she was previously dx with HTN, but never refilled RX for amlodipine.

## 2020-03-26 NOTE — ED Provider Notes (Signed)
[facility]    CSN: [id number] Arrival date & time: 03/26/20  0998      History   Chief Complaint Chief Complaint  Patient presents with  . Dysuria  . Back Pain    HPI Kristi Daniels is a 44 y.o. female.   Patient is a 44 year old female with a history of pyelonephritis and sciatica. Presents with increased urinary frequency and discomfort when urinating x 2 weeks. AZO has helped alleviate some of the urinary symptoms. Reports ongoing right shoulder pain, bilateral low back pain, and numbness/pain in bilateral lower extremities x several months. Denies precipitating injury or trauma. Aggravated by ambulating, mildly alleviated by rest and Tylenol. States Robaxin and Meloxicam helped in past.   ROS per HPI      Past Medical History:  Diagnosis Date  . Hypertension     There are no problems to display for this patient.   Past Surgical History:  Procedure Laterality Date  . JOINT REPLACEMENT     elbow     OB History   No obstetric history on file.      Home Medications    Prior to Admission medications   Medication Sig Start Date End Date Taking? Authorizing Provider  amLODipine (NORVASC) 10 MG tablet Take 1 tablet (10 mg total) by mouth daily. 03/26/20   Dahlia Byes A, NP  methocarbamol (ROBAXIN) 500 MG tablet Take 1 tablet (500 mg total) by mouth 2 (two) times daily. 03/26/20   Manal Kreutzer, Gloris Manchester A, NP  predniSONE (STERAPRED UNI-PAK 21 TAB) 10 MG (21) TBPK tablet 6 tabs for 1 day, then 5 tabs for 1 das, then 4 tabs for 1 day, then 3 tabs for 1 day, 2 tabs for 1 day, then 1 tab for 1 day 03/26/20   Dahlia Byes A, NP  promethazine (PHENERGAN) 25 MG tablet Take 1 tablet (25 mg total) by mouth every 8 (eight) hours as needed for nausea or vomiting. 06/03/19 03/26/20  Tilden Fossa, MD    Family History Family History  Problem Relation Age of Onset  . Diabetes Mother   . Hypertension Mother     Social History Social History   Tobacco Use  . Smoking  status: Current Every Day Smoker    Packs/day: 0.50    Types: Cigarettes  . Smokeless tobacco: Never Used  Substance Use Topics  . Alcohol use: Yes    Comment: socially  . Drug use: No     Allergies   Patient has no known allergies.   Review of Systems Review of Systems   Physical Exam Triage Vital Signs ED Triage Vitals  Enc Vitals Group     BP 03/26/20 0929 (!) 184/116     Pulse Rate 03/26/20 0929 86     Resp 03/26/20 0929 16     Temp 03/26/20 0929 98.2 F (36.8 C)     Temp Source 03/26/20 0929 Oral     SpO2 03/26/20 0929 99 %     Weight --      Height --      Head Circumference --      Peak Flow --      Pain Score 03/26/20 0927 6     Pain Loc --      Pain Edu? --      Excl. in GC? --    No data found.  Updated Vital Signs BP (!) 169/105 (BP Location: Left Arm) Comment: re-eval  Pulse 86   Temp 98.2 F (36.8 C) (  Oral)   Resp 16   LMP 11/27/2019 (Approximate)   SpO2 99%   Visual Acuity Right Eye Distance:   Left Eye Distance:   Bilateral Distance:    Right Eye Near:   Left Eye Near:    Bilateral Near:     Physical Exam Vitals and nursing note reviewed.  Constitutional:      General: She is not in acute distress.    Appearance: Normal appearance. She is not ill-appearing, toxic-appearing or diaphoretic.  HENT:     Head: Normocephalic.     Nose: Nose normal.     Mouth/Throat:     Pharynx: Oropharynx is clear.  Eyes:     Conjunctiva/sclera: Conjunctivae normal.  Pulmonary:     Effort: Pulmonary effort is normal.  Abdominal:     Palpations: Abdomen is soft.     Tenderness: There is no abdominal tenderness. There is no right CVA tenderness or left CVA tenderness.  Musculoskeletal:     Cervical back: Normal range of motion.     Lumbar back: Tenderness present. Decreased range of motion. Positive right straight leg raise test and positive left straight leg raise test.       Back:  Skin:    General: Skin is warm and dry.     Findings: No  rash.  Neurological:     Mental Status: She is alert.  Psychiatric:        Mood and Affect: Mood normal.      UC Treatments / Results  Labs (all labs ordered are listed, but only abnormal results are displayed) Labs Reviewed  POCT URINALYSIS DIP (DEVICE) - Abnormal; Notable for the following components:      Result Value   Protein, ur 100 (*)    All other components within normal limits    EKG   Radiology No results found.  Procedures Procedures (including critical care time)  Medications Ordered in UC Medications  methylPREDNISolone sodium succinate (SOLU-MEDROL) 125 mg/2 mL injection 80 mg (80 mg Intramuscular Given 03/26/20 1011)    Initial Impression / Assessment and Plan / UC Course  I have reviewed the triage vital signs and the nursing notes.  Pertinent labs & imaging results that were available during my care of the patient were reviewed by me and considered in my medical decision making (see chart for details).     Bilateral sciatica Treating with prednisone taper Steroid injection given here Start the taper tomorrow Robaxin for muscle relaxant.   Urinary frequency Urine negative for infection. Could be caused by constipation  recommended miralax.  Increase water and decrease caffeine and high surgery drinks.   Essential hypertension Refilled amlodipine.   Contacts put on discharge instructions for primary care follow up.    Final Clinical Impressions(s) / UC Diagnoses   Final diagnoses:  Bilateral sciatica  Urinary frequency  Pain in joint of right shoulder  Essential hypertension     Discharge Instructions     Your urine did not show any infection. Your urinary frequency and discomfort with urination could be due to some mild constipation.  You could try doing MiraLAX to see if this helps. We have refilled your blood pressure medicine and starting you on prednisone and refill the Robaxin for your sciatic nerve pain and shoulder pain   You need to follow-up with primary care I have put 3 contacts on your discharge instructions.     ED Prescriptions    Medication Sig Dispense Auth. Provider   methocarbamol (ROBAXIN) 500  MG tablet Take 1 tablet (500 mg total) by mouth 2 (two) times daily. 20 tablet Dameion Briles A, NP   meloxicam (MOBIC) 7.5 MG tablet  (Status: Discontinued) Take 1 tablet (7.5 mg total) by mouth daily. 30 tablet Keoki Mchargue A, NP   amLODipine (NORVASC) 10 MG tablet Take 1 tablet (10 mg total) by mouth daily. 30 tablet Dinisha Cai A, NP   predniSONE (STERAPRED UNI-PAK 21 TAB) 10 MG (21) TBPK tablet 6 tabs for 1 day, then 5 tabs for 1 das, then 4 tabs for 1 day, then 3 tabs for 1 day, 2 tabs for 1 day, then 1 tab for 1 day 21 tablet Mitchael Luckey A, NP     PDMP not reviewed this encounter.   Dahlia Byes A, NP 03/26/20 1141

## 2020-03-26 NOTE — Discharge Instructions (Addendum)
Your urine did not show any infection. Your urinary frequency and discomfort with urination could be due to some mild constipation.  You could try doing MiraLAX to see if this helps. We have refilled your blood pressure medicine and starting you on prednisone and refill the Robaxin for your sciatic nerve pain and shoulder pain  You need to follow-up with primary care I have put 3 contacts on your discharge instructions.

## 2020-05-12 ENCOUNTER — Other Ambulatory Visit: Payer: Self-pay | Admitting: Sports Medicine

## 2020-05-12 DIAGNOSIS — M545 Low back pain, unspecified: Secondary | ICD-10-CM

## 2020-05-24 ENCOUNTER — Other Ambulatory Visit: Payer: Medicaid Other

## 2020-06-20 ENCOUNTER — Ambulatory Visit
Admission: RE | Admit: 2020-06-20 | Discharge: 2020-06-20 | Disposition: A | Discharge status: Home / Self Care | Payer: Medicaid Other | Source: Ambulatory Visit | Attending: Sports Medicine | Admitting: Sports Medicine

## 2020-06-20 DIAGNOSIS — M545 Low back pain, unspecified: Secondary | ICD-10-CM

## 2020-06-26 ENCOUNTER — Other Ambulatory Visit: Payer: Self-pay | Admitting: Sports Medicine

## 2020-06-26 DIAGNOSIS — G8929 Other chronic pain: Secondary | ICD-10-CM

## 2020-07-02 ENCOUNTER — Other Ambulatory Visit: Payer: Self-pay

## 2020-07-02 ENCOUNTER — Other Ambulatory Visit: Payer: Medicaid Other

## 2020-07-02 ENCOUNTER — Ambulatory Visit
Admission: RE | Admit: 2020-07-02 | Discharge: 2020-07-02 | Disposition: A | Discharge status: Home / Self Care | Payer: Medicaid Other | Source: Ambulatory Visit | Attending: Sports Medicine | Admitting: Sports Medicine

## 2020-07-02 DIAGNOSIS — G8929 Other chronic pain: Secondary | ICD-10-CM

## 2020-07-02 MED ORDER — IOPAMIDOL (ISOVUE-M 200) INJECTION 41%
1.0000 mL | Freq: Once | INTRAMUSCULAR | Status: AC
  Administered 2020-07-02: 1 mL via EPIDURAL

## 2020-07-02 MED ORDER — METHYLPREDNISOLONE ACETATE 40 MG/ML INJ SUSP (RADIOLOG
120.0000 mg | Freq: Once | INTRAMUSCULAR | Status: AC
  Administered 2020-07-02: 120 mg via EPIDURAL

## 2020-07-02 NOTE — Discharge Instructions (Signed)

## 2020-08-06 ENCOUNTER — Other Ambulatory Visit: Payer: Self-pay | Admitting: Sports Medicine

## 2020-08-06 DIAGNOSIS — M545 Low back pain, unspecified: Secondary | ICD-10-CM

## 2020-08-06 DIAGNOSIS — G8929 Other chronic pain: Secondary | ICD-10-CM

## 2020-08-13 ENCOUNTER — Other Ambulatory Visit: Payer: Self-pay

## 2020-08-13 ENCOUNTER — Ambulatory Visit
Admission: RE | Admit: 2020-08-13 | Discharge: 2020-08-13 | Disposition: A | Discharge status: Home / Self Care | Payer: Medicaid Other | Source: Ambulatory Visit | Attending: Sports Medicine | Admitting: Sports Medicine

## 2020-08-13 DIAGNOSIS — G8929 Other chronic pain: Secondary | ICD-10-CM

## 2020-08-13 MED ORDER — IOPAMIDOL (ISOVUE-M 200) INJECTION 41%
1.0000 mL | Freq: Once | INTRAMUSCULAR | Status: AC
  Administered 2020-08-13: 1 mL via EPIDURAL

## 2020-08-13 MED ORDER — METHYLPREDNISOLONE ACETATE 40 MG/ML INJ SUSP (RADIOLOG
120.0000 mg | Freq: Once | INTRAMUSCULAR | Status: AC
  Administered 2020-08-13: 120 mg via EPIDURAL

## 2020-08-13 NOTE — Discharge Instructions (Signed)

## 2020-10-07 ENCOUNTER — Other Ambulatory Visit: Payer: Self-pay | Admitting: Sports Medicine

## 2020-10-07 DIAGNOSIS — G8929 Other chronic pain: Secondary | ICD-10-CM

## 2020-12-09 ENCOUNTER — Other Ambulatory Visit: Payer: Self-pay

## 2020-12-09 ENCOUNTER — Encounter: Payer: Self-pay | Admitting: Physical Therapy

## 2020-12-09 ENCOUNTER — Ambulatory Visit: Payer: Medicaid Other | Attending: Neurosurgery | Admitting: Physical Therapy

## 2020-12-09 DIAGNOSIS — M6281 Muscle weakness (generalized): Secondary | ICD-10-CM | POA: Insufficient documentation

## 2020-12-09 DIAGNOSIS — R252 Cramp and spasm: Secondary | ICD-10-CM | POA: Diagnosis present

## 2020-12-09 DIAGNOSIS — M5416 Radiculopathy, lumbar region: Secondary | ICD-10-CM | POA: Diagnosis present

## 2020-12-09 NOTE — Patient Instructions (Addendum)
[email or web address]   Access Code: [email or web address]: [email or web address]: 02/16/2022Prepared by: Victorino Dike PaaExercises  Supine Lower Trunk Rotation - 2-3 x daily - 7 x weekly - 1-2 sets - 10 reps - 20 hold  Supine Posterior Pelvic Tilt - 2-3 x daily - 7 x weekly - 1-2 sets - 10 reps - 5 hold  Hooklying Single Knee to Chest Stretch - 2-3 x daily - 7 x weekly - 1-2 sets - 10 reps - 30 hold

## 2020-12-09 NOTE — Therapy (Signed)
[facility] [address] [place], [place], [address] Phone: [phone]   Fax:  [phone]  Physical Therapy Evaluation  Patient Details  Name: Heather RobertsYolanda Y Ditmore MRN: [id number] Date of Birth: 08/15/1976 Referring Provider (PT): Dr. Donalee CitrinGary Cram   Encounter Date: 12/09/2020   PT End of Session - 12/09/20 1959    Visit Number 1    Number of Visits 16    Date for PT Re-Evaluation 02/03/21    Authorization Type [employer]    PT Start Time 1000    PT Stop Time 1045    PT Time Calculation (min) 45 min    Activity Tolerance Patient tolerated treatment well;Patient limited by pain    Behavior [facility] for tasks assessed/performed           Past Medical History:  Diagnosis Date   Hypertension     Past Surgical History:  Procedure Laterality Date   JOINT REPLACEMENT     elbow     There were no vitals filed for this visit.    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  She has had this pain for the past couple of years.  She reports pain in her neck and shoulders and progressed to her low back .  She eventually had sensory changes in hands, legs and feet.  she has had days to where she cannot move at all.  She cant sit or stand for too long. She has started falling in the recent weeks, her back and legs give out.    Pertinent History L elbow surgery, HTN, diabetes    Limitations Sitting;Walking;Lifting;House hold activities;Standing    How long can you sit comfortably? varies, max an hour    How long can you stand comfortably? washing dishes, 5-10 min    How long can you walk comfortably? walks about 5-10 min and then needs to rest.  Does not use the motorized buggy.    Diagnostic tests MRI , 2 injections    Patient Stated Goals Pt would like be able to avoid surgery.  Less pain.    Currently in Pain? Yes    Pain Score 7     Pain Location Back     Pain Orientation Right;Left    Pain Descriptors / Indicators Aching;Numbness;Other (Comment)   prickly   Pain Type Chronic pain    Pain Radiating Towards both legs to her feet    Pain Onset More than a month ago    Pain Frequency Constant    Aggravating Factors  sitting, standing , walking    Pain Relieving Factors laying down, medicine , rest    Effect of Pain on Daily Activities difficulty interacting with kids, friends, work    Multiple Pain Sites No              [facility] PT Assessment - 12/09/20 0001      Assessment   Medical Diagnosis lumbar stenosis    Referring Provider (PT) Dr. Donalee CitrinGary Cram    Onset Date/Surgical Date --   chronic   Prior Therapy No      Precautions   Precautions Fall      Restrictions   Weight Bearing Restrictions No      Balance Screen   Has the patient fallen in the past 6 months Yes    How many times? 7    Has the patient had a decrease in activity level  because of a fear of falling?  Yes    Is the patient reluctant to leave their home because of a fear of falling?  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    Leisure hobbies include TV, reading, kids, sewing      Cognition   Overall Cognitive Status Within Functional Limits for tasks assessed      Observation/Other Assessments   Focus on Therapeutic Outcomes (FOTO)  no FOTO MCD      Sensation   Light Touch Appears Intact    Additional Comments L leg numb per patient , including toes       Posture/Postural Control   Posture/Postural Control Postural limitations    Postural Limitations Anterior pelvic tilt      Strength   Right Hip Flexion 5/5    Left Hip Flexion 4/5    Right Knee Flexion 4/5    Right Knee Extension 5/5    Left Knee Flexion 4-/5    Left Knee Extension 4+/5    Right Ankle Dorsiflexion 5/5    Left Ankle Dorsiflexion 4+/5      Palpation   Spinal mobility spasm throughout    Palpation comment painful, spasm in low lumbar spine and glutes      Special Tests    Special Tests Lumbar      Straight Leg Raise   Findings Positive    Comment bilateral                      Objective measurements completed on examination: See above findings.               PT Education - 12/09/20 1959    Education Details PT, HEP, POC, stenosis, anatomy    Person(s) Educated Patient    Methods Explanation    Comprehension Verbalized understanding            PT Short Term Goals - 12/09/20 2001      PT SHORT TERM GOAL #1   Title Pt will be I with initial HEP for trunk and hip flexibility, core    Baseline given on eval    Time 4    Period Weeks    Status New    Target Date 01/06/21      PT SHORT TERM GOAL #2   Title Pt will be able to stand for ADLs, housework for longer periods 15 min before leg pain and back pain reach severw    Baseline 5-10 min    Time 4    Period Weeks    Status New    Target Date 01/06/21      PT SHORT TERM GOAL #3   Title Pt will be able to complete balance screening and goal set    Baseline unable on eval    Time 2    Period Weeks    Status New    Target  Date 12/23/20             PT Long Term Goals - 12/09/20 2004      PT LONG TERM GOAL #1   Title Patient will be able to walk in the community (to shop) for 30 min with pain in back, legs moderate    Baseline pain severe > 10-15 min    Time 8    Period Weeks    Status New    Target Date 02/03/21      PT LONG TERM GOAL #2   Title Pt will be  able to have trunk/lumbar flexibility without increased leg pain    Baseline pain with extension, limited 25% , rotation    Time 8    Period Weeks    Status New    Target Date 02/03/21      PT LONG TERM GOAL #3   Title Pt will be I with HEp upon discharge    Baseline unknown    Time 8    Period Weeks    Status New    Target Date 02/03/21      PT LONG TERM GOAL #4   Title Pt will be able to lift, squat up to 25lbs without increasing back pain    Baseline avoids, does not do , fearful    Time 8    Period Weeks    Status New    Target Date 02/03/21      PT LONG TERM GOAL #5   Title Pt will demo normal strength in LLE with testing and functional mobility    Baseline see flowsheets, 4-/5    Time 8    Period Weeks    Status New    Target Date 02/03/21                  Plan - 12/09/20 1041    Clinical Impression Statement Patient presents for mod complexity eval of lumbar pain due to stenosis with LE claudication and sensory disturbance. Her issues are multilevel in  low lumbar spine, L side more symptomatic than Rt. She has LLE weakness and pain with hip ROM. Her symptoms are worsening and she has fallen >6 times in the last 6 mos.  She is hoping to avoid surgery.  She may improve to a degree as she has thus far not done PT in the past.    Personal Factors and Comorbidities Comorbidity 3+;Time since onset of injury/illness/exacerbation    Comorbidities diabetes, HTN, obesity    Examination-Activity Limitations Sit;Bend;Lift;Squat;Caring for Others;Locomotion Level;Stairs;Carry;Reach Overhead;Stand    Examination-Participation Restrictions Interpersonal Relationship;Occupation;Laundry;Cleaning;Community Activity;Meal Prep;Driving;Shop    Stability/Clinical Decision Making Evolving/Moderate complexity    Clinical Decision Making Moderate    Rehab Potential Good    PT Frequency 2x / week    PT Duration 8 weeks    PT Treatment/Interventions ADLs/Self Care Home  Management;Cryotherapy;Traction;Gait training;Therapeutic exercise;Electrical Stimulation;Moist Heat;Aquatic Therapy;Functional mobility training;Therapeutic activities;Neuromuscular re-education;Balance training;Patient/family education;Manual techniques;Passive range of motion;Taping;Dry needling;Ultrasound;DME Instruction    PT Next Visit Plan balance assess, check HEP, manual Lt side    PT Home Exercise Plan LTR, supine PPT and knee to chest with towel    Consulted and Agree with Plan of Care Patient           Patient will benefit from skilled therapeutic intervention in order to improve the following deficits and impairments:  Decreased balance,Decreased mobility,Decreased endurance,Difficulty walking,Hypomobility,Increased muscle spasms,Obesity,Decreased range of motion,Decreased activity tolerance,Decreased strength,Increased fascial restricitons,Impaired flexibility,Postural dysfunction,Pain  Visit Diagnosis: Radiculopathy,  lumbar region  Muscle weakness (generalized)  Cramp and spasm     Problem List There are no problems to display for this patient.   Karver Fadden 12/09/2020, 8:09 PM  [facility] [address] [place], [place], [address] Phone: [phone]   Fax:  [phone]  Name: MCKYNZI CAMMON MRN: [id number] Date of Birth: November 10, 1975   [facility] Authorization   Choose one: Rehabilitative  Standardized Assessment or Functional Outcome Tool: See Pain Assessment and 5x sit to stand  Score or Percent Disability:  Body Parts Treated (Select each separately):  1. Lumbopelvic. Overall deficits/functional limitations for body part selected: severe  Karie Mainland, PT 12/09/20 8:11 PM Phone: [phone] Fax: [phone]

## 2020-12-25 ENCOUNTER — Ambulatory Visit: Payer: Medicaid Other | Admitting: Physical Therapy

## 2020-12-28 ENCOUNTER — Other Ambulatory Visit: Payer: Self-pay

## 2020-12-28 ENCOUNTER — Encounter: Payer: Self-pay | Admitting: Physical Therapy

## 2020-12-28 ENCOUNTER — Ambulatory Visit: Payer: Medicaid Other | Attending: Neurosurgery | Admitting: Physical Therapy

## 2020-12-28 DIAGNOSIS — M6281 Muscle weakness (generalized): Secondary | ICD-10-CM | POA: Insufficient documentation

## 2020-12-28 DIAGNOSIS — M5416 Radiculopathy, lumbar region: Secondary | ICD-10-CM | POA: Diagnosis not present

## 2020-12-28 DIAGNOSIS — R252 Cramp and spasm: Secondary | ICD-10-CM

## 2020-12-28 NOTE — Patient Instructions (Signed)
Access Code: [email or web address] URL: [email or web address] Date: 12/28/2020 Prepared by: Jannette Spanner  Exercises Supine Lower Trunk Rotation - 2-3 x daily - 7 x weekly - 1-2 sets - 10 reps - 20 hold Supine Posterior Pelvic Tilt - 2-3 x daily - 7 x weekly - 1-2 sets - 10 reps - 5 hold Hooklying Single Knee to Chest Stretch - 2-3 x daily - 7 x weekly - 1-2 sets - 10 reps - 30 hold Hooklying Straight Leg Raise - 2 x daily - 7 x weekly - 2 sets - 10 reps Sit to Stand Without Arm Support - 2 x daily - 7 x weekly - 2 sets - 10 reps

## 2020-12-28 NOTE — Therapy (Signed)
[facility] [address] [place], [place], [address] Phone: [phone]   Fax:  [phone]  Physical Therapy Treatment  Patient Details  Name: Kristi Daniels MRN: [id number] Date of Birth: Sep 06, 1976 Referring Provider (PT): Dr. Donalee Citrin   Encounter Date: 12/28/2020   PT End of Session - 12/28/20 1021    Visit Number 2    Number of Visits 16    Date for PT Re-Evaluation 02/03/21    Authorization Type [employer]    Authorization Time Period 12/25/20-02/23/21    Authorization - Visit Number 1    Authorization - Number of Visits 12    PT Start Time 1016    PT Stop Time 1054    PT Time Calculation (min) 38 min           Past Medical History:  Diagnosis Date  . Hypertension     Past Surgical History:  Procedure Laterality Date  . JOINT REPLACEMENT     elbow     There were no vitals filed for this visit.   Subjective Assessment - 12/28/20 1018    Subjective I am doing the exercises but I do not know if they are helping or hurting the situation.    Currently in Pain? Yes    Pain Score 5     Pain Location Back    Pain Orientation Right;Left    Pain Descriptors / Indicators Aching;Numbness    Pain Type Chronic pain    Pain Radiating Towards both legs to feet    Aggravating Factors  sitting, standing, walking    Pain Relieving Factors lay down, meds, rest              [facility] PT Assessment - 12/28/20 0001      Standardized Balance Assessment   Standardized Balance Assessment Berg Balance Test;Five Times Sit to Stand    Five times sit to stand comments  18.7 sec without UE      Berg Balance Test   Sit to Stand Able to stand without using hands and stabilize independently    Standing Unsupported Able to stand safely 2 minutes    Sitting with Back Unsupported but Feet Supported on Floor or Stool Able to sit safely and securely 2 minutes    Stand to Sit Sits safely with minimal use of hands    Transfers Able to  transfer safely, minor use of hands    Standing Unsupported with Eyes Closed Able to stand 10 seconds safely    Standing Unsupported with Feet Together Able to place feet together independently and stand 1 minute safely    From Standing, Reach Forward with Outstretched Arm Can reach confidently >25 cm (10")    From Standing Position, Pick up Object from Floor Able to pick up shoe safely and easily    From Standing Position, Turn to Look Behind Over each Shoulder Looks behind from both sides and weight shifts well    Turn 360 Degrees Able to turn 360 degrees safely one side only in 4 seconds or less    Standing Unsupported, Alternately Place Feet on Step/Stool Able to stand independently and safely and complete 8 steps in 20 seconds    Standing Unsupported, One Foot in Front Able to plae foot ahead of the other independently and hold 30 seconds    Standing on One Leg Able to lift leg independently and hold > 10 seconds   24 sec   Total Score 54  Sharlene Motts comment: 71/56                         [facility] Adult PT Treatment/Exercise - 12/28/20 0001      Exercises   Exercises Lumbar      Lumbar Exercises: Stretches   Single Knee to Chest Stretch 3 reps;30 seconds    Lower Trunk Rotation 10 seconds    Lower Trunk Rotation Limitations 10 reps      Lumbar Exercises: Seated   Sit to Stand 10 reps    Sit to Stand Limitations 2 sets without UE      Lumbar Exercises: Supine   Pelvic Tilt 10 reps    Clam 20 reps    Clam Limitations green band , cues for abdominal draw in    Bridge 10 reps    Straight Leg Raise 10 reps    Straight Leg Raises Limitations 2 sets                  PT Education - 12/28/20 1051    Education Details HEP    Person(s) Educated Patient    Methods Explanation;Handout    Comprehension Verbalized understanding            PT Short Term Goals - 12/09/20 2001      PT SHORT TERM GOAL #1   Title Pt will be I with initial HEP for trunk and hip  flexibility, core    Baseline given on eval    Time 4    Period Weeks    Status New    Target Date 01/06/21      PT SHORT TERM GOAL #2   Title Pt will be able to stand for ADLs, housework for longer periods 15 min before leg pain and back pain reach severw    Baseline 5-10 min    Time 4    Period Weeks    Status New    Target Date 01/06/21      PT SHORT TERM GOAL #3   Title Pt will be able to complete balance screening and goal set    Baseline unable on eval    Time 2    Period Weeks    Status New    Target Date 12/23/20             PT Long Term Goals - 12/09/20 2004      PT LONG TERM GOAL #1   Title Patient will be able to walk in the community (to shop) for 30 min with pain in back, legs moderate    Baseline pain severe > 10-15 min    Time 8    Period Weeks    Status New    Target Date 02/03/21      PT LONG TERM GOAL #2   Title Pt will be able to have trunk/lumbar flexibility without increased leg pain    Baseline pain with extension, limited 25% , rotation    Time 8    Period Weeks    Status New    Target Date 02/03/21      PT LONG TERM GOAL #3   Title Pt will be I with HEp upon discharge    Baseline unknown    Time 8    Period Weeks    Status New    Target Date 02/03/21      PT LONG TERM GOAL #4   Title Pt will be able to lift, squat up  to 25lbs without increasing back pain    Baseline avoids, does not do , fearful    Time 8    Period Weeks    Status New    Target Date 02/03/21      PT LONG TERM GOAL #5   Title Pt will demo normal strength in LLE with testing and functional mobility    Baseline see flowsheets, 4-/5    Time 8    Period Weeks    Status New    Target Date 02/03/21                 Plan - 12/28/20 1101    Clinical Impression Statement Pt arrives reporting compliance with HEP, no change. Reviewed HEP and progressed with LE strengthening.  BERG 54/56. 5 X STS 18.7 sec. Pt given updated HEP. Her pain increased from 5/10 to  7/10 by the end of session.    PT Next Visit Plan balance assess, check HEP, manual Lt side,    PT Home Exercise Plan LTR, supine PPT and knee to chest with towel, added SLR and sit-stands           Patient will benefit from skilled therapeutic intervention in order to improve the following deficits and impairments:  Decreased balance,Decreased mobility,Decreased endurance,Difficulty walking,Hypomobility,Increased muscle spasms,Obesity,Decreased range of motion,Decreased activity tolerance,Decreased strength,Increased fascial restricitons,Impaired flexibility,Postural dysfunction,Pain  Visit Diagnosis: Radiculopathy, lumbar region  Muscle weakness (generalized)  Cramp and spasm     Problem List There are no problems to display for this patient.   Sherrie Mustache, [place] 12/28/2020, 11:09 AM  [facility] [address] [place], [place], [address] Phone: [phone]   Fax:  [phone]  Name: Kristi Daniels MRN: [id number] Date of Birth: 06/19/1976

## 2020-12-30 ENCOUNTER — Ambulatory Visit: Payer: Medicaid Other | Admitting: Physical Therapy

## 2021-01-04 ENCOUNTER — Encounter: Payer: Self-pay | Admitting: Physical Therapy

## 2021-01-04 ENCOUNTER — Other Ambulatory Visit: Payer: Self-pay

## 2021-01-04 ENCOUNTER — Ambulatory Visit: Payer: Medicaid Other | Admitting: Physical Therapy

## 2021-01-04 DIAGNOSIS — R252 Cramp and spasm: Secondary | ICD-10-CM

## 2021-01-04 DIAGNOSIS — M5416 Radiculopathy, lumbar region: Secondary | ICD-10-CM | POA: Diagnosis not present

## 2021-01-04 DIAGNOSIS — M6281 Muscle weakness (generalized): Secondary | ICD-10-CM

## 2021-01-04 NOTE — Therapy (Signed)
[place], [place], [address] Phone: [phone]   Fax:  [phone]  Physical Therapy Treatment  Patient Details  Name: EURIKA SANDY MRN: [id number] Date of Birth: 1976-07-29 Referring Provider (PT): Dr. Kary Kos   Encounter Date: 01/04/2021   PT End of Session - 01/04/21 0934    Visit Number 3    Number of Visits 16    Date for PT Re-Evaluation 02/03/21    Authorization Type [employer], 12 visits    Authorization Time Period 12/25/20-02/23/21    Authorization - Visit Number 2    Authorization - Number of Visits 12    PT Start Time 0931    PT Stop Time 1013    PT Time Calculation (min) 42 min           Past Medical History:  Diagnosis Date  . Hypertension     Past Surgical History:  Procedure Laterality Date  . JOINT REPLACEMENT     elbow     There were no vitals filed for this visit.   Subjective Assessment - 01/04/21 0933    Subjective I am still doing the exercises. I am not really noticing any improvement.    Currently in Pain? Yes    Pain Score 4     Pain Location Back    Pain Orientation Right;Left    Pain Descriptors / Indicators Aching    Pain Type Chronic pain    Pain Radiating Towards both legs to feet    Aggravating Factors  sitting, standng, walking    Pain Relieving Factors lay down, meds, rest                             [facility] Adult PT Treatment/Exercise - 01/04/21 0001      Lumbar Exercises: Stretches   Single Knee to Chest Stretch 3 reps;30 seconds    Lower Trunk Rotation 10 seconds    Lower Trunk Rotation Limitations 10 reps    Piriformis Stretch 30 seconds;3 reps    Piriformis Stretch Limitations right ,left    Gastroc Stretch 20 seconds;2 reps      Lumbar Exercises: Aerobic   Nustep L5 UE/LE x 5 minutes      Lumbar Exercises: Standing   Heel Raises 15 reps    Other Standing Lumbar Exercises tandem stance 30 sec x 2      Lumbar  Exercises: Seated   Sit to Stand 10 reps    Sit to Stand Limitations 2 sets without UE      Lumbar Exercises: Supine   Pelvic Tilt 15 reps    Clam 20 reps    Clam Limitations green band , cues for abdominal draw in    Bridge 15 reps    Straight Leg Raise 10 reps    Straight Leg Raises Limitations 2 sets                    PT Short Term Goals - 01/04/21 1006      PT SHORT TERM GOAL #1   Title Pt will be I with initial HEP for trunk and hip flexibility, core    Time 4    Period Weeks    Status Achieved      PT SHORT TERM GOAL #2   Title Pt will be able to stand for ADLs, housework for longer periods 15 min before leg pain and back pain  reach severw    Baseline 5-10 min    Time 4    Period Weeks    Status On-going      PT SHORT TERM GOAL #3   Title Pt will be able to complete balance screening and goal set    Baseline BERG 54/56    Time 2    Period Weeks    Status Partially Met             PT Long Term Goals - 12/09/20 2004      PT LONG TERM GOAL #1   Title Patient will be able to walk in the community (to shop) for 30 min with pain in back, legs moderate    Baseline pain severe > 10-15 min    Time 8    Period Weeks    Status New    Target Date 02/03/21      PT LONG TERM GOAL #2   Title Pt will be able to have trunk/lumbar flexibility without increased leg pain    Baseline pain with extension, limited 25% , rotation    Time 8    Period Weeks    Status New    Target Date 02/03/21      PT LONG TERM GOAL #3   Title Pt will be I with HEp upon discharge    Baseline unknown    Time 8    Period Weeks    Status New    Target Date 02/03/21      PT LONG TERM GOAL #4   Title Pt will be able to lift, squat up to 25lbs without increasing back pain    Baseline avoids, does not do , fearful    Time 8    Period Weeks    Status New    Target Date 02/03/21      PT LONG TERM GOAL #5   Title Pt will demo normal strength in LLE with testing and functional  mobility    Baseline see flowsheets, 4-/5    Time 8    Period Weeks    Status New    Target Date 02/03/21                 Plan - 01/04/21 1008    Clinical Impression Statement Pt arrives reporting continued compliance with HEP but does not note any improvement in pain or function. Continued with Hip and core strength. Added balance and calf stretch/piriformis stretch today. She tolerated session well with pain increased to 6/10 at end of session.    PT Next Visit Plan balance assess, check HEP, manual Lt side,    PT Home Exercise Plan LTR, supine PPT and knee to chest with towel, added SLR and sit-stands           Patient will benefit from skilled therapeutic intervention in order to improve the following deficits and impairments:  Decreased balance,Decreased mobility,Decreased endurance,Difficulty walking,Hypomobility,Increased muscle spasms,Obesity,Decreased range of motion,Decreased activity tolerance,Decreased strength,Increased fascial restricitons,Impaired flexibility,Postural dysfunction,Pain  Visit Diagnosis: Radiculopathy, lumbar region  Muscle weakness (generalized)  Cramp and spasm     Problem List There are no problems to display for this patient.   Dorene Ar, [place] 01/04/2021, 10:17 AM  [facility] [address] [place], [place], [address] Phone: [phone]   Fax:  [phone]  Name: ETIENNE MILLWARD MRN: [id number] Date of Birth: 17-Oct-1976

## 2021-01-06 ENCOUNTER — Other Ambulatory Visit: Payer: Self-pay

## 2021-01-06 ENCOUNTER — Ambulatory Visit: Payer: Medicaid Other | Admitting: Physical Therapy

## 2021-01-06 ENCOUNTER — Encounter: Payer: Self-pay | Admitting: Physical Therapy

## 2021-01-06 DIAGNOSIS — M6281 Muscle weakness (generalized): Secondary | ICD-10-CM

## 2021-01-06 DIAGNOSIS — M5416 Radiculopathy, lumbar region: Secondary | ICD-10-CM | POA: Diagnosis not present

## 2021-01-06 DIAGNOSIS — R252 Cramp and spasm: Secondary | ICD-10-CM

## 2021-01-06 NOTE — Therapy (Signed)
[place], [place], [address] Phone: [phone]   Fax:  [phone]  Physical Therapy Treatment  Patient Details  Name: Kristi Daniels MRN: [id number] Date of Birth: [birth year] Referring Provider (PT): Dr. Kary Daniels   Encounter Date: 01/06/2021   PT End of Session - 01/06/21 1131    Visit Number 4    Number of Visits 16    Date for PT Re-Evaluation 02/03/21    Authorization Type [employer], 12 visits    Authorization Time Period 12/25/20-02/23/21    Authorization - Visit Number 3    Authorization - Number of Visits 12    PT Start Time 9977    PT Stop Time 1140    PT Time Calculation (min) 51 min    Activity Tolerance Patient tolerated treatment well;Patient limited by pain    Behavior During Therapy [facility] for tasks assessed/performed           Past Medical History:  Diagnosis Date  . Hypertension     Past Surgical History:  Procedure Laterality Date  . JOINT REPLACEMENT     elbow     There were no vitals filed for this visit.   Subjective Assessment - 01/06/21 1051    Subjective Patient knows she is "not going to get better until she gets the disc fixed".  Did too much Monday (PT, housework, walk) and that was "awful". She caneelled her appt with Dr. Jamse [place] she wants to see if a few more visits of PT could make a difference.    Currently in Pain? Yes    Pain Score 6     Pain Location Back    Pain Orientation Right;Left    Pain Descriptors / Indicators Numbness    Pain Type Chronic pain    Pain Radiating Towards both legs L>R back and lateral aspect numb    Pain Onset More than a month ago    Pain Frequency Constant    Aggravating Factors  prolonged anything    Pain Relieving Factors lay downs, meds with min relief , rest              [facility] Adult PT Treatment/Exercise - 01/06/21 0001      Lumbar Exercises: Stretches   Single Knee to Chest Stretch 3 reps;30 seconds    Lower  Trunk Rotation 10 seconds    Lower Trunk Rotation Limitations 5 then legs crossed x 5 each side      Lumbar Exercises: Aerobic   Nustep L5 UE/LE x 5 minutes      Lumbar Exercises: Supine   Bridge 15 reps    Bridge Limitations knees slightly wider    Straight Leg Raise 10 reps    Straight Leg Raises Limitations 2 sets      Moist Heat Therapy   Number Minutes Moist Heat 15 Minutes    Moist Heat Location Lumbar Spine      Electrical Stimulation   Electrical Stimulation Location lumbar    Electrical Stimulation Action IFC    Electrical Stimulation Parameters to tolerance    Electrical Stimulation Goals Pain      Manual Therapy   Soft tissue mobilization lumbar paraspinals , L piriformis and glute med    Myofascial Release lumbar                    PT Short Term Goals - 01/04/21 1006      PT SHORT TERM GOAL #1  Title Pt will be I with initial HEP for trunk and hip flexibility, core    Time 4    Period Weeks    Status Achieved      PT SHORT TERM GOAL #2   Title Pt will be able to stand for ADLs, housework for longer periods 15 min before leg pain and back pain reach severw    Baseline 5-10 min    Time 4    Period Weeks    Status On-going      PT SHORT TERM GOAL #3   Title Pt will be able to complete balance screening and goal set    Baseline BERG 54/56    Time 2    Period Weeks    Status Partially Met             PT Long Term Goals - 12/09/20 2004      PT LONG TERM GOAL #1   Title Patient will be able to walk in the community (to shop) for 30 min with pain in back, legs moderate    Baseline pain severe > 10-15 min    Time 8    Period Weeks    Status New    Target Date 02/03/21      PT LONG TERM GOAL #2   Title Pt will be able to have trunk/lumbar flexibility without increased leg pain    Baseline pain with extension, limited 25% , rotation    Time 8    Period Weeks    Status New    Target Date 02/03/21      PT LONG TERM GOAL #3   Title Pt  will be I with HEp upon discharge    Baseline unknown    Time 8    Period Weeks    Status New    Target Date 02/03/21      PT LONG TERM GOAL #4   Title Pt will be able to lift, squat up to 25lbs without increasing back pain    Baseline avoids, does not do , fearful    Time 8    Period Weeks    Status New    Target Date 02/03/21      PT LONG TERM GOAL #5   Title Pt will demo normal strength in LLE with testing and functional mobility    Baseline see flowsheets, 4-/5    Time 8    Period Weeks    Status New    Target Date 02/03/21                 Plan - 01/06/21 1102    Clinical Impression Statement Patient continues to have pain in back and sensory disturbance in L >R Leg impacting her abiltiy to do housework, walk and be comfortable in her home. She was urged to continue with a few more visits.  Trial of modalities for pain reduction.    PT Treatment/Interventions ADLs/Self Care Home Management;Cryotherapy;Traction;Gait training;Therapeutic exercise;Electrical Stimulation;Moist Heat;Aquatic Therapy;Functional mobility training;Therapeutic activities;Neuromuscular re-education;Balance training;Patient/family education;Manual techniques;Passive range of motion;Taping;Dry needling;Ultrasound;DME Instruction    PT Next Visit Plan effect of modalties. manual Lt side, cont core and trunk flexibility    PT Home Exercise Plan LTR, supine PPT and knee to chest with towel, added SLR and sit-stands    Consulted and Agree with Plan of Care Patient           Patient will benefit from skilled therapeutic intervention in order to improve the following deficits and impairments:  Decreased balance,Decreased mobility,Decreased endurance,Difficulty walking,Hypomobility,Increased muscle spasms,Obesity,Decreased range of motion,Decreased activity tolerance,Decreased strength,Increased fascial restricitons,Impaired flexibility,Postural dysfunction,Pain  Visit Diagnosis: Radiculopathy, lumbar  region  Muscle weakness (generalized)  Cramp and spasm     Problem List There are no problems to display for this patient.   Kristi Daniels 01/06/2021, 11:32 AM  [facility] [address] [place], [place], [address] Phone: [phone]   Fax:  [phone]  Name: Kristi Daniels MRN: [id number] Date of Birth: [birth year]  Raeford Razor, PT 01/06/21 11:32 AM Phone: [phone] Fax: [phone]

## 2021-01-11 ENCOUNTER — Ambulatory Visit: Payer: Medicaid Other | Admitting: Physical Therapy

## 2021-01-11 ENCOUNTER — Encounter: Payer: Self-pay | Admitting: Physical Therapy

## 2021-01-11 ENCOUNTER — Other Ambulatory Visit: Payer: Self-pay

## 2021-01-11 DIAGNOSIS — M5416 Radiculopathy, lumbar region: Secondary | ICD-10-CM | POA: Diagnosis not present

## 2021-01-11 DIAGNOSIS — M6281 Muscle weakness (generalized): Secondary | ICD-10-CM

## 2021-01-11 DIAGNOSIS — R252 Cramp and spasm: Secondary | ICD-10-CM

## 2021-01-11 NOTE — Therapy (Addendum)
[place], [place], [address] Phone: [phone]   Fax:  [phone]  Physical Therapy Treatment/Discharge  Patient Details  Name: Kristi Daniels MRN: [id number] Date of Birth: [birth year] Referring Provider (PT): Dr. Kary Kos   Encounter Date: 01/11/2021   PT End of Session - 01/11/21 1040    Visit Number 5    Number of Visits 16    Date for PT Re-Evaluation 02/03/21    Authorization Type [employer], 12 visits    Authorization Time Period 12/25/20-02/23/21    Authorization - Visit Number 4    Authorization - Number of Visits 12    PT Start Time 1000    PT Stop Time 1040    PT Time Calculation (min) 40 min    Activity Tolerance Patient tolerated treatment well    Behavior During Therapy [facility] for tasks assessed/performed           Past Medical History:  Diagnosis Date  . Hypertension     Past Surgical History:  Procedure Laterality Date  . JOINT REPLACEMENT     elbow     There were no vitals filed for this visit.   Subjective Assessment - 01/11/21 1003    Subjective My back was sore after last visit.  Once she came off the [facility] it was sore. Today pain is 7/10.    Currently in Pain? Yes    Pain Score 7     Pain Location Back    Pain Orientation Right;Left;Lower    Pain Type Chronic pain    Pain Onset More than a month ago    Pain Frequency Constant    Aggravating Factors  standing is the worst    Pain Relieving Factors laying down, meds, rest    Multiple Pain Sites No               [facility] Adult PT Treatment/Exercise - 01/11/21 0001      Lumbar Exercises: Stretches   Active Hamstring Stretch 3 reps;30 seconds    Single Knee to Chest Stretch 1 rep;30 seconds    Double Knee to Chest Stretch 1 rep;30 seconds    Lower Trunk Rotation 10 seconds    Lower Trunk Rotation Limitations x 10 with physio ball    Pelvic Tilt 10 reps      Lumbar Exercises: Standing   Heel Raises 15 reps     Heel Raises Limitations 10 lbs KB    Functional Squats 10 reps    Functional Squats Limitations 10 lbs    Lifting From 12";10 reps    Lifting Weights (lbs) 10    Lifting Limitations dead lift, used step stool for target (8 inch)    Other Standing Lumbar Exercises Palloff press and rotate blue x 15 each      Lumbar Exercises: Seated   Sit to Stand 10 reps    Sit to Stand Limitations 10 lb KB x 2 sets    Other Seated Lumbar Exercises flexion stretching with ball , forward and lateral , 1 mon each      Lumbar Exercises: Supine   Heel Slides Limitations with ball x 15    Bridge 10 reps    Bridge Limitations legs on physio ball   2 sets   Straight Leg Raises Limitations with core ball press x 10 each    Large Ball Abdominal Isometric 15 reps    Large Ball Abdominal Isometric Limitations supine core press with UE and  LE                  PT Education - 01/11/21 1031    Education Details lifting, body mechanics    Person(s) Educated Patient    Methods Explanation    Comprehension Verbalized understanding;Returned demonstration            PT Short Term Goals - 01/04/21 1006      PT SHORT TERM GOAL #1   Title Pt will be I with initial HEP for trunk and hip flexibility, core    Time 4    Period Weeks    Status Achieved      PT SHORT TERM GOAL #2   Title Pt will be able to stand for ADLs, housework for longer periods 15 min before leg pain and back pain reach severw    Baseline 5-10 min    Time 4    Period Weeks    Status On-going      PT SHORT TERM GOAL #3   Title Pt will be able to complete balance screening and goal set    Baseline BERG 54/56    Time 2    Period Weeks    Status Partially Met             PT Long Term Goals - 12/09/20 2004      PT LONG TERM GOAL #1   Title Patient will be able to walk in the community (to shop) for 30 min with pain in back, legs moderate    Baseline pain severe > 10-15 min    Time 8    Period Weeks    Status New     Target Date 02/03/21      PT LONG TERM GOAL #2   Title Pt will be able to have trunk/lumbar flexibility without increased leg pain    Baseline pain with extension, limited 25% , rotation    Time 8    Period Weeks    Status New    Target Date 02/03/21      PT LONG TERM GOAL #3   Title Pt will be I with HEp upon discharge    Baseline unknown    Time 8    Period Weeks    Status New    Target Date 02/03/21      PT LONG TERM GOAL #4   Title Pt will be able to lift, squat up to 25lbs without increasing back pain    Baseline avoids, does not do , fearful    Time 8    Period Weeks    Status New    Target Date 02/03/21      PT LONG TERM GOAL #5   Title Pt will demo normal strength in LLE with testing and functional mobility    Baseline see flowsheets, 4-/5    Time 8    Period Weeks    Status New    Target Date 02/03/21                 Plan - 01/11/21 1014    Clinical Impression Statement Patient continues to have pain in her back and bilateral LEs despite pain about 7/10.  She was able to do her housework this weekend, takes frequent breaks to avoid overworking her back. She was able to do her exercises today at mat levels without increased pain , using physio ball for core exercises. Need to transition to standing core for more challenge. She said she  has a disc piece that is touching a nerve but this was not seen in her chart.    PT Treatment/Interventions ADLs/Self Care Home Management;Cryotherapy;Traction;Gait training;Therapeutic exercise;Electrical Stimulation;Moist Heat;Aquatic Therapy;Functional mobility training;Therapeutic activities;Neuromuscular re-education;Balance training;Patient/family education;Manual techniques;Passive range of motion;Taping;Dry needling;Ultrasound;DME Instruction    PT Next Visit Plan try manual Lt side, cont core/standing, lifting and hinging. and trunk flexibility    PT Home Exercise Plan LTR, supine PPT and knee to chest with towel, added  SLR and sit-stands    Consulted and Agree with Plan of Care Patient           Patient will benefit from skilled therapeutic intervention in order to improve the following deficits and impairments:  Decreased balance,Decreased mobility,Decreased endurance,Difficulty walking,Hypomobility,Increased muscle spasms,Obesity,Decreased range of motion,Decreased activity tolerance,Decreased strength,Increased fascial restricitons,Impaired flexibility,Postural dysfunction,Pain  Visit Diagnosis: Radiculopathy, lumbar region  Muscle weakness (generalized)  Cramp and spasm     Problem List There are no problems to display for this patient.   Skylynn Burkley 01/11/2021, 10:41 AM  [facility] [address] [place], [place], [address] Phone: [phone]   Fax:  [phone]  Name: KYRIANNA BARLETTA MRN: [id number] Date of Birth: 09-03-1976  Raeford Razor, PT 01/11/21 10:41 AM Phone: [phone] Fax: [phone]  PHYSICAL THERAPY DISCHARGE SUMMARY  Visits from Start of Care: 5  Current functional level related to goals / functional outcomes: See above, unknown     Remaining deficits: See above , unknown   Education / Equipment: HEP Plan: Patient agrees to discharge.  Patient goals were not met. Patient is being discharged due to not returning since the last visit.  ?????     Raeford Razor, PT 03/01/21 9:30 AM Phone: [phone] Fax: [phone]

## 2021-01-13 ENCOUNTER — Ambulatory Visit: Payer: Medicaid Other | Admitting: Physical Therapy

## 2021-02-02 ENCOUNTER — Ambulatory Visit: Payer: Medicaid Other | Admitting: Physical Therapy

## 2021-04-20 ENCOUNTER — Other Ambulatory Visit: Payer: Self-pay | Admitting: Neurosurgery

## 2021-05-21 NOTE — Pre-Procedure Instructions (Signed)
Surgical Instructions   Your procedure is scheduled on Monday, August 8th, 2022. Report to [facility] Main Entrance "A" at 05:30 A.M., then check in with the Admitting office. Call this number if you have problems the morning of [phone]   If you have any questions prior to your surgery date call [phone]: Open Monday-Friday 8am-4pm   Remember: Do not eat or drink after midnight the night before your surgery   Take these medicines the morning of surgery with A SIP OF WATER  amLODipine (NORVASC) buPROPion (WELLBUTRIN SR)  simvastatin (ZOCOR)  venlafaxine XR (EFFEXOR-XR)   If Needed: cyclobenzaprine (FLEXERIL) gabapentin (NEURONTIN)   As of today, STOP taking meloxicam (MOBIC), any Aspirin (unless otherwise instructed by your surgeon) Aleve, Naproxen, Ibuprofen, Motrin, Advil, Goody's, BC's, all herbal medications, fish oil, and all vitamins.  WHAT DO I DO ABOUT MY DIABETES MEDICATION?  Do not take empagliflozin (JARDIANCE) the day before surgery (05/30/21) or the morning of surgery (05/31/21).  The day of surgery, do not take Trulicity (dulaglutide).   HOW TO MANAGE YOUR DIABETES BEFORE AND AFTER SURGERY  Why is it important to control my blood sugar before and after surgery? Improving blood sugar levels before and after surgery helps healing and can limit problems. A way of improving blood sugar control is eating a healthy diet by:  Eating less sugar and carbohydrates  Increasing activity/exercise  Talking with your doctor about reaching your blood sugar goals High blood sugars (greater than 180 mg/dL) can raise your risk of infections and slow your recovery, so you will need to focus on controlling your diabetes during the weeks before surgery. Make sure that the doctor who takes care of your diabetes knows about your planned surgery including the date and location.  How do I manage my blood sugar before surgery? Check your blood sugar at least 4 times a day,  starting 2 days before surgery, to make sure that the level is not too high or low.  Check your blood sugar the morning of your surgery when you wake up and every 2 hours until you get to the Short Stay unit.  If your blood sugar is less than 70 mg/dL, you will need to treat for low blood sugar: Do not take insulin. Treat a low blood sugar (less than 70 mg/dL) with  cup of clear juice (cranberry or apple), 4 glucose tablets, OR glucose gel. Recheck blood sugar in 15 minutes after treatment (to make sure it is greater than 70 mg/dL). If your blood sugar is not greater than 70 mg/dL on recheck, call [phone] for further instructions. Report your blood sugar to the short stay nurse when you get to Short Stay.  If you are admitted to the hospital after surgery: Your blood sugar will be checked by the staff and you will probably be given insulin after surgery (instead of oral diabetes medicines) to make sure you have good blood sugar levels. The goal for blood sugar control after surgery is 80-180 mg/dL.           Do not wear jewelry or makeup Do not wear lotions, powders, perfumes, or deodorant. Do not shave 48 hours prior to surgery.   Do not bring valuables to the hospital. [facility] is not responsible for any belongings or valuables. DO Not wear nail polish, gel polish, artificial nails, or any other type of covering on natural nails including finger and toenails. If patients have artificial nails, gel coating, etc. that need to be removed  by a nail salon please have this removed prior to surgery or surgery may need to be canceled/delayed if the surgeon/ anesthesia feels like the patient is unable to be adequately monitored.  Do NOT Smoke (Tobacco/Vaping) or drink Alcohol 24 hours prior to your procedure  Contacts, glasses, dentures or bridgework may not be worn into surgery, please bring cases for these belongings   For patients admitted to the hospital, discharge time will be  determined by your treatment team.   Patients discharged the day of surgery will not be allowed to drive home, and someone needs to stay with them for 24 hours.  ONLY 1 SUPPORT PERSON MAY BE PRESENT WHILE YOU ARE IN SURGERY. IF YOU ARE TO BE ADMITTED ONCE YOU ARE IN YOUR ROOM YOU WILL BE ALLOWED TWO (2) VISITORS.  Minor children may have two parents present. Special consideration for safety and communication needs will be reviewed on a case by case basis.  Special instructions:    Oral Hygiene is also important to reduce your risk of infection.  Remember - BRUSH YOUR TEETH THE MORNING OF SURGERY WITH YOUR REGULAR TOOTHPASTE   [place]- Preparing For Surgery  Before surgery, you can play an important role. Because skin is not sterile, your skin needs to be as free of germs as possible. You can reduce the number of germs on your skin by washing with CHG (chlorahexidine gluconate) Soap before surgery.  CHG is an antiseptic cleaner which kills germs and bonds with the skin to continue killing germs even after washing.     Please do not use if you have an allergy to CHG or antibacterial soaps. If your skin becomes reddened/irritated stop using the CHG. Do not shave (including legs and underarms) for at least 48 hours prior to first CHG shower. It is OK to shave your face.  Please follow these instructions carefully.     Shower the NIGHT BEFORE SURGERY and the MORNING OF SURGERY with CHG Soap.   If you chose to wash your hair, wash your hair first as usual with your normal shampoo. After you shampoo, rinse your hair and body thoroughly to remove the shampoo.  Then [employer] and genitals (private parts) with your normal soap and rinse thoroughly to remove soap.  After that Use CHG Soap as you would any other liquid soap. You can apply CHG directly to the skin and wash gently with a scrungie or a clean washcloth.   Apply the CHG Soap to your body ONLY FROM THE NECK DOWN.  Do not use on open  wounds or open sores. Avoid contact with your eyes, ears, mouth and genitals (private parts). Wash Face and genitals (private parts)  with your normal soap.   Wash thoroughly, paying special attention to the area where your surgery will be performed.  Thoroughly rinse your body with warm water from the neck down.  DO NOT shower/wash with your normal soap after using and rinsing off the CHG Soap.  Pat yourself dry with a CLEAN TOWEL.  Wear CLEAN PAJAMAS to bed the night before surgery  Place CLEAN SHEETS on your bed the night before your surgery  DO NOT SLEEP WITH PETS.   Day of Surgery:  Take a shower with CHG soap. Wear Clean/Comfortable clothing the morning of surgery Do not apply any deodorants/lotions.   Remember to brush your teeth WITH YOUR REGULAR TOOTHPASTE.   Please read over the following fact sheets that you were given.

## 2021-05-24 ENCOUNTER — Encounter (HOSPITAL_COMMUNITY): Payer: Self-pay | Admitting: Neurosurgery

## 2021-05-24 ENCOUNTER — Other Ambulatory Visit: Payer: Self-pay

## 2021-05-24 ENCOUNTER — Encounter (HOSPITAL_COMMUNITY)
Admission: RE | Admit: 2021-05-24 | Discharge: 2021-05-24 | Disposition: A | Discharge status: Home / Self Care | Payer: Medicaid Other | Source: Ambulatory Visit | Attending: Neurosurgery | Admitting: Neurosurgery

## 2021-05-24 DIAGNOSIS — Z01812 Encounter for preprocedural laboratory examination: Secondary | ICD-10-CM | POA: Insufficient documentation

## 2021-05-24 DIAGNOSIS — Z9289 Personal history of other medical treatment: Secondary | ICD-10-CM

## 2021-05-24 HISTORY — DX: Personal history of other medical treatment: Z92.89

## 2021-05-24 LAB — BASIC METABOLIC PANEL
Anion gap: 9 (ref 5–15)
BUN: 5 mg/dL — ABNORMAL LOW (ref 6–20)
CO2: 29 mmol/L (ref 22–32)
Calcium: 9 mg/dL (ref 8.9–10.3)
Chloride: 102 mmol/L (ref 98–111)
Creatinine, Ser: 0.67 mg/dL (ref 0.44–1.00)
GFR, Estimated: >60 mL/min (ref >60)
Glucose, Bld: 105 mg/dL — ABNORMAL HIGH (ref 70–99)
Potassium: 2.7 mmol/L — CL (ref 3.5–5.1)
Sodium: 140 mmol/L (ref 135–145)

## 2021-05-24 LAB — GLUCOSE, CAPILLARY: Glucose-Capillary: 129 mg/dL — ABNORMAL HIGH (ref 70–99)

## 2021-05-24 LAB — TYPE AND SCREEN
ABO/RH(D): B POS
Antibody Screen: NEGATIVE

## 2021-05-24 LAB — CBC
HCT: 39.3 % (ref 36.0–46.0)
Hemoglobin: 12.5 g/dL (ref 12.0–15.0)
MCH: 24.9 pg — ABNORMAL LOW (ref 26.0–34.0)
MCHC: 31.8 g/dL (ref 30.0–36.0)
MCV: 78.1 fL — ABNORMAL LOW (ref 80.0–100.0)
Platelets: 328 10*3/uL (ref 150–400)
RBC: 5.03 MIL/uL (ref 3.87–5.11)
RDW: 17 % — ABNORMAL HIGH (ref 11.5–15.5)
WBC: 8.8 10*3/uL (ref 4.0–10.5)
nRBC: 0 % (ref 0.0–0.2)

## 2021-05-24 LAB — HEMOGLOBIN A1C
Hgb A1c MFr Bld: 6.5 % — ABNORMAL HIGH (ref 4.8–5.6)
Mean Plasma Glucose: 139.85 mg/dL

## 2021-05-24 LAB — SURGICAL PCR SCREEN
MRSA, PCR: NEGATIVE
Staphylococcus aureus: NEGATIVE

## 2021-05-24 NOTE — Progress Notes (Addendum)
Critical lab result recorded of K+ 2.7 at PAT appt. Dr. Anastasia Pall surgery scheduler contacted. She said she will let the surgeon know. Pt's chart will be sent for anesthesia review.

## 2021-05-24 NOTE — Progress Notes (Signed)
PCP - Ellender Hose, NP Cardiologist - denies  PPM/ICD - denies  Chest x-ray - 06/02/2019 EKG - 06/02/2019 Stress Test - denies ECHO - denies Cardiac Cath - denies  Sleep Study - denies  DM- yes, type 2 Fasting Blood Sugar - 120's Checks Blood Sugar 3 times a day  Pt told to stop taking Meloxicam along with other blood-thinning medications listed on pre-surgery directions  ERAS Protcol - No  COVID TEST- Pt told to go to testing site on 05/27/21 or 05/28/21 prior to surgery date   Anesthesia review: No  Patient denies shortness of breath, fever, cough and chest pain at PAT appointment   All instructions explained to the patient, with a verbal understanding of the material. Patient agrees to go over the instructions while at home for a better understanding. The opportunity to ask questions was provided.

## 2021-05-25 NOTE — Anesthesia Preprocedure Evaluation (Addendum)
Anesthesia Evaluation  Patient identified by MRN, date of birth, ID band Patient awake    Reviewed: Allergy & Precautions, NPO status , Patient's Chart, lab work & pertinent test results  Airway Mallampati: II  TM Distance: >3 FB Neck ROM: Full    Dental  (+) Dental Advisory Given   Pulmonary former smoker,    breath sounds clear to auscultation       Cardiovascular hypertension, Pt. on medications  Rhythm:Regular Rate:Normal     Neuro/Psych negative neurological ROS     GI/Hepatic negative GI ROS, Neg liver ROS,   Endo/Other  diabetes  Renal/GU negative Renal ROS     Musculoskeletal  (+) Arthritis ,   Abdominal   Peds  Hematology negative hematology ROS (+)   Anesthesia Other Findings   Reproductive/Obstetrics                            Lab Results  Component Value Date   WBC 8.8 05/24/2021   HGB 12.5 05/24/2021   HCT 39.3 05/24/2021   MCV 78.1 (L) 05/24/2021   PLT 328 05/24/2021   Lab Results  Component Value Date   CREATININE 0.67 05/24/2021   BUN 5 (L) 05/24/2021   NA 140 05/24/2021   K 2.7 (LL) 05/24/2021   CL 102 05/24/2021   CO2 29 05/24/2021    Anesthesia Physical Anesthesia Plan  ASA: 2  Anesthesia Plan: General   Post-op Pain Management:    Induction: Intravenous  PONV Risk Score and Plan: 3 and Ondansetron, Dexamethasone, Midazolam and Treatment may vary due to age or medical condition  Airway Management Planned: Oral ETT  Additional Equipment:   Intra-op Plan:   Post-operative Plan: Extubation in OR  Informed Consent: I have reviewed the patients History and Physical, chart, labs and discussed the procedure including the risks, benefits and alternatives for the proposed anesthesia with the patient or authorized representative who has indicated his/her understanding and acceptance.     Dental advisory given  Plan Discussed with:  CRNA  Anesthesia Plan Comments: ( )      Anesthesia Quick Evaluation

## 2021-05-25 NOTE — Progress Notes (Signed)
Anesthesia Chart Review:  Case: [id number] Date/Time: 05/31/21 0715   Procedure: PLIF - L4-L5 - Interbody Fusion (Back)   Anesthesia type: General   Pre-op diagnosis: Stenosis   Location: [facility] ROOM 20 / [facility] OR   Surgeons: Donalee Citrin, MD       DISCUSSION: Patient is a 45 year old female scheduled for the above procedure.  History includes former smoker (quit 03/24/21), HTN, DM2, arthritis, tubal ligation (02/14/03), left elbow replacement. BMI is consistent with obesity.   Preoperative labs show a low potassium of 2.7. Results was called to Erie Noe at Dr. Lonie Peak office. Patient was reportedly advised to follow-up with her PCP. I contacted patient after 5:00 PM on 05/25/21, and she reported that her  PCP called her in KCl supplements this afternoon--she doesn't know the details yet because she will be picking up the prescription on 05/26/21 AM. I advised she take as prescribed in hopes her potassium would be > 3.0 for the day of surgery. She does not think she is getting repeat labs at her PCP between now and surgery, so advised that if not done we will plan to recheck on arrival. We also discussed that she would meet criteria (HTN, DM) for a preoperative EKG. Since one was not done during her PAT visit, I asked if she would be able to get one at PAT or at [facility] between now and surgery. Given surgery is in less than a week, she did not think she could fit another appointment for an EKG in her schedule. She denied CAD/MI or CHF history. She denied chest pain, SOB, edema, syncope.  She has been dealing with back issues for over a year now, and it does limit her activity. She tries to be as active as she can including cleaning her home, but has to take intermittent breaks due to back pain. She denied prior cardiac testing. I gave her times she could drop by PAT for her EKG, and discussed that if she was unable to get EKG before surgery, then we would have to do it on the day of surgery.   LMP documented  as ~ 11/27/19. Has history of BTL in 2004. Anesthesia team to evaluate on the day of surgery.  Patient to get preoperative COVID-19 testing on either 05/27/21 or 05/28/21. She will need an ISTAT on the day of surgery if potassium has not been rechecked and an EKG if she was unable to come back to PAT prior to surgery.    VS: BP 130/80   Pulse 91   Temp 36.8 C   Resp 18   Ht 5\' 2"  (1.575 m)   Wt 92.7 kg   LMP 11/27/2019 (Approximate)   SpO2 99%   BMI 37.37 kg/m    PROVIDERS: 01/25/2020, NP is documented as PCP ([facility])   LABS: Preoperative labs noted. K low at 2.7. PAT RN already called result to surgeon's staff. Will order ISTAT for the day of surgery to re-check.  (all labs ordered are listed, but only abnormal results are displayed)  Labs Reviewed  GLUCOSE, CAPILLARY - Abnormal; Notable for the following components:      Result Value   Glucose-Capillary 129 (*)    All other components within normal limits  SURGICAL PCR SCREEN    IMAGES: MRI L-spine 06/20/20: IMPRESSION: 1. Multifactorial degenerative changes at L4-5 with resultant moderate canal and bilateral subarticular stenosis, with moderate bilateral L4 foraminal narrowing. 2. Severe right and moderate left facet hypertrophy at L4-5  and L5-S1. Finding could contribute to lower back pain. 3. 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 Careful correlation with numbering system on this exam recommended prior to any potential future intervention.     EKG: Last EKG noted is from 06/02/19 (in setting of fever, pyelonephritis). [facility] does not have an EKG on file there.    CV: Denied prior stress test, echo, cardiac cath.    Past Medical History:  Diagnosis Date   Arthritis    "in back"   Diabetes mellitus without complication (HCC)    type 2   Hypertension     Past Surgical History:  Procedure Laterality Date   JOINT REPLACEMENT      elbow    TUBAL LIGATION Bilateral 2004    MEDICATIONS:  amLODipine (NORVASC) 10 MG tablet   buPROPion (WELLBUTRIN SR) 150 MG 12 hr tablet   cyclobenzaprine (FLEXERIL) 10 MG tablet   empagliflozin (JARDIANCE) 10 MG TABS tablet   gabapentin (NEURONTIN) 800 MG tablet   meloxicam (MOBIC) 15 MG tablet   methocarbamol (ROBAXIN) 500 MG tablet   predniSONE (STERAPRED UNI-PAK 21 TAB) 10 MG (21) TBPK tablet   simvastatin (ZOCOR) 5 MG tablet   TRULICITY 0.75 MG/0.5ML SOPN   venlafaxine XR (EFFEXOR-XR) 75 MG 24 hr capsule   No current facility-administered medications for this encounter.  By current list, she is no longer taking prednisone pack or Robaxin. Mobic on hold starting 05/24/21.     Shonna Chock, PA-C Surgical Short Stay/Anesthesiology [facility] Phone [phone] [facility] Phone [phone] 05/25/2021 5:49 PM

## 2021-05-28 ENCOUNTER — Other Ambulatory Visit: Payer: Self-pay | Admitting: Neurosurgery

## 2021-05-28 LAB — SARS CORONAVIRUS 2 (TAT 6-24 HRS): SARS Coronavirus 2: NEGATIVE

## 2021-05-31 ENCOUNTER — Other Ambulatory Visit: Payer: Self-pay

## 2021-05-31 ENCOUNTER — Ambulatory Visit (HOSPITAL_COMMUNITY): Payer: Medicaid Other

## 2021-05-31 ENCOUNTER — Ambulatory Visit (HOSPITAL_COMMUNITY): Payer: Medicaid Other | Admitting: Vascular Surgery

## 2021-05-31 ENCOUNTER — Encounter (HOSPITAL_COMMUNITY)
Admission: RE | Disposition: A | Discharge status: Home / Self Care | Payer: Self-pay | Source: Home / Self Care | Attending: Neurosurgery

## 2021-05-31 ENCOUNTER — Encounter (HOSPITAL_COMMUNITY): Payer: Self-pay | Admitting: Neurosurgery

## 2021-05-31 ENCOUNTER — Ambulatory Visit (HOSPITAL_COMMUNITY)
Admission: RE | Admit: 2021-05-31 | Discharge: 2021-06-01 | Disposition: A | Discharge status: Home / Self Care | Payer: Medicaid Other | Attending: Neurosurgery | Admitting: Neurosurgery

## 2021-05-31 DIAGNOSIS — Z791 Long term (current) use of non-steroidal anti-inflammatories (NSAID): Secondary | ICD-10-CM | POA: Insufficient documentation

## 2021-05-31 DIAGNOSIS — M5116 Intervertebral disc disorders with radiculopathy, lumbar region: Secondary | ICD-10-CM | POA: Insufficient documentation

## 2021-05-31 DIAGNOSIS — Z79899 Other long term (current) drug therapy: Secondary | ICD-10-CM | POA: Diagnosis not present

## 2021-05-31 DIAGNOSIS — M48061 Spinal stenosis, lumbar region without neurogenic claudication: Secondary | ICD-10-CM | POA: Diagnosis present

## 2021-05-31 DIAGNOSIS — Z419 Encounter for procedure for purposes other than remedying health state, unspecified: Secondary | ICD-10-CM

## 2021-05-31 DIAGNOSIS — M4316 Spondylolisthesis, lumbar region: Secondary | ICD-10-CM | POA: Diagnosis not present

## 2021-05-31 DIAGNOSIS — Z7984 Long term (current) use of oral hypoglycemic drugs: Secondary | ICD-10-CM | POA: Diagnosis not present

## 2021-05-31 DIAGNOSIS — Z87891 Personal history of nicotine dependence: Secondary | ICD-10-CM | POA: Insufficient documentation

## 2021-05-31 DIAGNOSIS — Z8249 Family history of ischemic heart disease and other diseases of the circulatory system: Secondary | ICD-10-CM | POA: Insufficient documentation

## 2021-05-31 DIAGNOSIS — E119 Type 2 diabetes mellitus without complications: Secondary | ICD-10-CM | POA: Insufficient documentation

## 2021-05-31 DIAGNOSIS — Z833 Family history of diabetes mellitus: Secondary | ICD-10-CM | POA: Insufficient documentation

## 2021-05-31 DIAGNOSIS — Z7952 Long term (current) use of systemic steroids: Secondary | ICD-10-CM | POA: Diagnosis not present

## 2021-05-31 HISTORY — DX: Type 2 diabetes mellitus without complications: E11.9

## 2021-05-31 HISTORY — DX: Unspecified osteoarthritis, unspecified site: M19.90

## 2021-05-31 LAB — POCT I-STAT, CHEM 8
BUN: 9 mg/dL (ref 6–20)
Calcium, Ion: 1.06 mmol/L — ABNORMAL LOW (ref 1.15–1.40)
Chloride: 101 mmol/L (ref 98–111)
Creatinine, Ser: 0.6 mg/dL (ref 0.44–1.00)
Glucose, Bld: 148 mg/dL — ABNORMAL HIGH (ref 70–99)
HCT: 35 % — ABNORMAL LOW (ref 36.0–46.0)
Hemoglobin: 11.9 g/dL — ABNORMAL LOW (ref 12.0–15.0)
Potassium: 2.6 mmol/L — CL (ref 3.5–5.1)
Sodium: 141 mmol/L (ref 135–145)
TCO2: 29 mmol/L (ref 22–32)

## 2021-05-31 LAB — GLUCOSE, CAPILLARY
Glucose-Capillary: 144 mg/dL — ABNORMAL HIGH (ref 70–99)
Glucose-Capillary: 176 mg/dL — ABNORMAL HIGH (ref 70–99)
Glucose-Capillary: 189 mg/dL — ABNORMAL HIGH (ref 70–99)
Glucose-Capillary: 174 mg/dL — ABNORMAL HIGH (ref 70–99)

## 2021-05-31 LAB — ABO/RH: ABO/RH(D): B POS

## 2021-05-31 SURGERY — POSTERIOR LUMBAR FUSION 1 LEVEL
Anesthesia: General | Site: Back

## 2021-05-31 MED ORDER — ROCURONIUM BROMIDE 10 MG/ML (PF) SYRINGE
PREFILLED_SYRINGE | INTRAVENOUS | Status: DC | PRN
  Administered 2021-05-31 (×2): 20 mg via INTRAVENOUS
  Administered 2021-05-31: 60 mg via INTRAVENOUS

## 2021-05-31 MED ORDER — ACETAMINOPHEN 650 MG RE SUPP: 650.0000 mg | RECTAL | Status: DC | PRN

## 2021-05-31 MED ORDER — ACETAMINOPHEN 500 MG PO TABS: 1000.0000 mg | ORAL_TABLET | Freq: Once | ORAL | Status: DC

## 2021-05-31 MED ORDER — CHLORHEXIDINE GLUCONATE 0.12 % MT SOLN
OROMUCOSAL | Status: AC
  Administered 2021-05-31: 15 mL via OROMUCOSAL
  Filled 2021-05-31: qty 15

## 2021-05-31 MED ORDER — THROMBIN 20000 UNITS EX SOLR
CUTANEOUS | Status: AC
  Filled 2021-05-31: qty 20000

## 2021-05-31 MED ORDER — AMISULPRIDE (ANTIEMETIC) 5 MG/2ML IV SOLN: 10.0000 mg | Freq: Once | INTRAVENOUS | Status: DC | PRN

## 2021-05-31 MED ORDER — PHENYLEPHRINE 40 MCG/ML (10ML) SYRINGE FOR IV PUSH (FOR BLOOD PRESSURE SUPPORT)
PREFILLED_SYRINGE | INTRAVENOUS | Status: DC | PRN
  Administered 2021-05-31: 120 ug via INTRAVENOUS
  Administered 2021-05-31: 80 ug via INTRAVENOUS

## 2021-05-31 MED ORDER — HYDROMORPHONE HCL 1 MG/ML IJ SOLN
0.2500 mg | INTRAMUSCULAR | Status: DC | PRN
  Administered 2021-05-31 (×3): 0.5 mg via INTRAVENOUS

## 2021-05-31 MED ORDER — HYDROMORPHONE HCL 1 MG/ML IJ SOLN
0.5000 mg | INTRAMUSCULAR | Status: DC | PRN
  Administered 2021-05-31 (×3): 0.5 mg via INTRAVENOUS
  Filled 2021-05-31 (×3): qty 0.5

## 2021-05-31 MED ORDER — DULAGLUTIDE 0.75 MG/0.5ML ~~LOC~~ SOAJ: 0.7500 mg | SUBCUTANEOUS | Status: DC

## 2021-05-31 MED ORDER — SODIUM CHLORIDE 0.9 % IV SOLN: 250.0000 mL | INTRAVENOUS | Status: DC

## 2021-05-31 MED ORDER — 0.9 % SODIUM CHLORIDE (POUR BTL) OPTIME
TOPICAL | Status: DC | PRN
  Administered 2021-05-31 (×2): 1000 mL

## 2021-05-31 MED ORDER — CHLORHEXIDINE GLUCONATE 0.12 % MT SOLN: 15.0000 mL | Freq: Once | OROMUCOSAL | Status: AC

## 2021-05-31 MED ORDER — GABAPENTIN 800 MG PO TABS
800.0000 mg | ORAL_TABLET | Freq: Three times a day (TID) | ORAL | Status: DC | PRN
  Filled 2021-05-31: qty 1

## 2021-05-31 MED ORDER — BUPIVACAINE LIPOSOME 1.3 % IJ SUSP
INTRAMUSCULAR | Status: AC
  Filled 2021-05-31: qty 20

## 2021-05-31 MED ORDER — SODIUM CHLORIDE 0.9% FLUSH
3.0000 mL | Freq: Two times a day (BID) | INTRAVENOUS | Status: DC
  Administered 2021-05-31: 3 mL via INTRAVENOUS

## 2021-05-31 MED ORDER — THROMBIN 20000 UNITS EX SOLR
CUTANEOUS | Status: DC | PRN
  Administered 2021-05-31: 20 mL via TOPICAL

## 2021-05-31 MED ORDER — LACTATED RINGERS IV SOLN: INTRAVENOUS | Status: DC

## 2021-05-31 MED ORDER — SUGAMMADEX SODIUM 200 MG/2ML IV SOLN
INTRAVENOUS | Status: DC | PRN
  Administered 2021-05-31: 200 mg via INTRAVENOUS

## 2021-05-31 MED ORDER — CEFAZOLIN SODIUM-DEXTROSE 2-4 GM/100ML-% IV SOLN
2.0000 g | INTRAVENOUS | Status: AC
  Administered 2021-05-31: 2 g via INTRAVENOUS

## 2021-05-31 MED ORDER — PANTOPRAZOLE SODIUM 40 MG IV SOLR: 40.0000 mg | Freq: Every day | INTRAVENOUS | Status: DC

## 2021-05-31 MED ORDER — ONDANSETRON HCL 4 MG/2ML IJ SOLN: 4.0000 mg | Freq: Four times a day (QID) | INTRAMUSCULAR | Status: DC | PRN

## 2021-05-31 MED ORDER — BUPIVACAINE HCL (PF) 0.25 % IJ SOLN
INTRAMUSCULAR | Status: AC
  Filled 2021-05-31: qty 30

## 2021-05-31 MED ORDER — ACETAMINOPHEN 325 MG PO TABS
650.0000 mg | ORAL_TABLET | ORAL | Status: DC | PRN
  Filled 2021-05-31: qty 2

## 2021-05-31 MED ORDER — DEXAMETHASONE SODIUM PHOSPHATE 10 MG/ML IJ SOLN: 10.0000 mg | Freq: Once | INTRAMUSCULAR | Status: DC

## 2021-05-31 MED ORDER — LIDOCAINE 2% (20 MG/ML) 5 ML SYRINGE
INTRAMUSCULAR | Status: AC
  Filled 2021-05-31: qty 5

## 2021-05-31 MED ORDER — FENTANYL CITRATE (PF) 250 MCG/5ML IJ SOLN
INTRAMUSCULAR | Status: DC | PRN
  Administered 2021-05-31 (×3): 50 ug via INTRAVENOUS
  Administered 2021-05-31: 100 ug via INTRAVENOUS

## 2021-05-31 MED ORDER — MIDAZOLAM HCL 2 MG/2ML IJ SOLN
INTRAMUSCULAR | Status: DC | PRN
  Administered 2021-05-31: 2 mg via INTRAVENOUS

## 2021-05-31 MED ORDER — ACETAMINOPHEN 10 MG/ML IV SOLN
INTRAVENOUS | Status: AC
  Filled 2021-05-31: qty 100

## 2021-05-31 MED ORDER — DEXAMETHASONE SODIUM PHOSPHATE 10 MG/ML IJ SOLN
INTRAMUSCULAR | Status: AC
  Filled 2021-05-31: qty 1

## 2021-05-31 MED ORDER — PROPOFOL 10 MG/ML IV BOLUS
INTRAVENOUS | Status: DC | PRN
  Administered 2021-05-31: 150 mg via INTRAVENOUS
  Administered 2021-05-31: 50 mg via INTRAVENOUS

## 2021-05-31 MED ORDER — BUPIVACAINE LIPOSOME 1.3 % IJ SUSP
INTRAMUSCULAR | Status: DC | PRN
  Administered 2021-05-31: 20 mL

## 2021-05-31 MED ORDER — HYDROMORPHONE HCL 1 MG/ML IJ SOLN
INTRAMUSCULAR | Status: AC
  Filled 2021-05-31: qty 1

## 2021-05-31 MED ORDER — INSULIN ASPART 100 UNIT/ML IJ SOLN
0.0000 [IU] | Freq: Three times a day (TID) | INTRAMUSCULAR | Status: DC
  Administered 2021-05-31: 3 [IU] via SUBCUTANEOUS

## 2021-05-31 MED ORDER — LIDOCAINE-EPINEPHRINE 1 %-1:100000 IJ SOLN
INTRAMUSCULAR | Status: AC
  Filled 2021-05-31: qty 1

## 2021-05-31 MED ORDER — POTASSIUM CHLORIDE 10 MEQ/100ML IV SOLN
INTRAVENOUS | Status: DC | PRN
  Administered 2021-05-31 (×3): 10 meq via INTRAVENOUS

## 2021-05-31 MED ORDER — OXYCODONE HCL 5 MG PO TABS
10.0000 mg | ORAL_TABLET | ORAL | Status: DC | PRN
  Administered 2021-05-31: 10 mg via ORAL
  Filled 2021-05-31 (×2): qty 2

## 2021-05-31 MED ORDER — BUPROPION HCL ER (SR) 150 MG PO TB12
150.0000 mg | ORAL_TABLET | Freq: Two times a day (BID) | ORAL | Status: DC
  Administered 2021-05-31 – 2021-06-01 (×2): 150 mg via ORAL
  Filled 2021-05-31 (×2): qty 1

## 2021-05-31 MED ORDER — EMPAGLIFLOZIN 10 MG PO TABS
10.0000 mg | ORAL_TABLET | Freq: Every day | ORAL | Status: DC
  Administered 2021-06-01: 10 mg via ORAL
  Filled 2021-05-31: qty 1

## 2021-05-31 MED ORDER — DEXAMETHASONE SODIUM PHOSPHATE 10 MG/ML IJ SOLN
INTRAMUSCULAR | Status: DC | PRN
  Administered 2021-05-31: 10 mg via INTRAVENOUS

## 2021-05-31 MED ORDER — MIDAZOLAM HCL 2 MG/2ML IJ SOLN
INTRAMUSCULAR | Status: AC
  Filled 2021-05-31: qty 2

## 2021-05-31 MED ORDER — PANTOPRAZOLE SODIUM 40 MG PO TBEC
40.0000 mg | DELAYED_RELEASE_TABLET | Freq: Every day | ORAL | Status: DC
  Administered 2021-05-31: 40 mg via ORAL
  Filled 2021-05-31: qty 1

## 2021-05-31 MED ORDER — ORAL CARE MOUTH RINSE: 15.0000 mL | Freq: Once | OROMUCOSAL | Status: AC

## 2021-05-31 MED ORDER — ACETAMINOPHEN 10 MG/ML IV SOLN
INTRAVENOUS | Status: DC | PRN
  Administered 2021-05-31: 1000 mg via INTRAVENOUS

## 2021-05-31 MED ORDER — MENTHOL 3 MG MT LOZG: 1.0000 | LOZENGE | OROMUCOSAL | Status: DC | PRN

## 2021-05-31 MED ORDER — CEFAZOLIN SODIUM-DEXTROSE 2-4 GM/100ML-% IV SOLN
INTRAVENOUS | Status: AC
  Filled 2021-05-31: qty 100

## 2021-05-31 MED ORDER — MELOXICAM 7.5 MG PO TABS
15.0000 mg | ORAL_TABLET | Freq: Every day | ORAL | Status: DC
  Administered 2021-06-01: 15 mg via ORAL
  Filled 2021-05-31: qty 2

## 2021-05-31 MED ORDER — AMLODIPINE BESYLATE 5 MG PO TABS
10.0000 mg | ORAL_TABLET | Freq: Every day | ORAL | Status: DC
  Administered 2021-06-01: 10 mg via ORAL
  Filled 2021-05-31: qty 2

## 2021-05-31 MED ORDER — ALUM & MAG HYDROXIDE-SIMETH 200-200-20 MG/5ML PO SUSP: 30.0000 mL | Freq: Four times a day (QID) | ORAL | Status: DC | PRN

## 2021-05-31 MED ORDER — VENLAFAXINE HCL ER 75 MG PO CP24
75.0000 mg | ORAL_CAPSULE | Freq: Every day | ORAL | Status: DC
  Administered 2021-05-31 – 2021-06-01 (×2): 75 mg via ORAL
  Filled 2021-05-31 (×2): qty 1

## 2021-05-31 MED ORDER — THROMBIN 5000 UNITS EX SOLR
CUTANEOUS | Status: AC
  Filled 2021-05-31: qty 5000

## 2021-05-31 MED ORDER — CYCLOBENZAPRINE HCL 10 MG PO TABS
10.0000 mg | ORAL_TABLET | Freq: Three times a day (TID) | ORAL | Status: DC | PRN
  Administered 2021-05-31 – 2021-06-01 (×2): 10 mg via ORAL
  Filled 2021-05-31 (×2): qty 1

## 2021-05-31 MED ORDER — ONDANSETRON HCL 4 MG/2ML IJ SOLN
INTRAMUSCULAR | Status: AC
  Filled 2021-05-31: qty 2

## 2021-05-31 MED ORDER — SIMVASTATIN 5 MG PO TABS
5.0000 mg | ORAL_TABLET | Freq: Every day | ORAL | Status: DC
  Administered 2021-05-31: 5 mg via ORAL
  Filled 2021-05-31: qty 1

## 2021-05-31 MED ORDER — GABAPENTIN 400 MG PO CAPS: 800.0000 mg | ORAL_CAPSULE | Freq: Three times a day (TID) | ORAL | Status: DC | PRN

## 2021-05-31 MED ORDER — CEFAZOLIN SODIUM-DEXTROSE 2-4 GM/100ML-% IV SOLN
2.0000 g | Freq: Three times a day (TID) | INTRAVENOUS | Status: DC
Start: 2021-05-31 — End: 2021-06-01
  Administered 2021-05-31 – 2021-06-01 (×3): 2 g via INTRAVENOUS
  Filled 2021-05-31 (×3): qty 100

## 2021-05-31 MED ORDER — ONDANSETRON HCL 4 MG/2ML IJ SOLN
INTRAMUSCULAR | Status: DC | PRN
  Administered 2021-05-31: 4 mg via INTRAVENOUS

## 2021-05-31 MED ORDER — ROCURONIUM BROMIDE 10 MG/ML (PF) SYRINGE
PREFILLED_SYRINGE | INTRAVENOUS | Status: AC
  Filled 2021-05-31: qty 10

## 2021-05-31 MED ORDER — CHLORHEXIDINE GLUCONATE CLOTH 2 % EX PADS: 6.0000 | MEDICATED_PAD | Freq: Once | CUTANEOUS | Status: DC

## 2021-05-31 MED ORDER — SODIUM CHLORIDE 0.9% FLUSH: 3.0000 mL | INTRAVENOUS | Status: DC | PRN

## 2021-05-31 MED ORDER — CHLORHEXIDINE GLUCONATE CLOTH 2 % EX PADS
6.0000 | MEDICATED_PAD | Freq: Once | CUTANEOUS | Status: DC
Start: 2021-05-31 — End: 2021-05-31

## 2021-05-31 MED ORDER — PROPOFOL 10 MG/ML IV BOLUS
INTRAVENOUS | Status: AC
  Filled 2021-05-31: qty 20

## 2021-05-31 MED ORDER — LIDOCAINE-EPINEPHRINE 1 %-1:100000 IJ SOLN
INTRAMUSCULAR | Status: DC | PRN
  Administered 2021-05-31: 10 mL

## 2021-05-31 MED ORDER — CYCLOBENZAPRINE HCL 10 MG PO TABS: 10.0000 mg | ORAL_TABLET | Freq: Three times a day (TID) | ORAL | Status: DC | PRN

## 2021-05-31 MED ORDER — ONDANSETRON HCL 4 MG PO TABS: 4.0000 mg | ORAL_TABLET | Freq: Four times a day (QID) | ORAL | Status: DC | PRN

## 2021-05-31 MED ORDER — PHENOL 1.4 % MT LIQD: 1.0000 | OROMUCOSAL | Status: DC | PRN

## 2021-05-31 MED ORDER — LIDOCAINE 2% (20 MG/ML) 5 ML SYRINGE
INTRAMUSCULAR | Status: DC | PRN
Start: 2021-05-31 — End: 2021-05-31
  Administered 2021-05-31: 60 mg via INTRAVENOUS

## 2021-05-31 MED ORDER — FENTANYL CITRATE (PF) 250 MCG/5ML IJ SOLN
INTRAMUSCULAR | Status: AC
  Filled 2021-05-31: qty 5

## 2021-05-31 SURGICAL SUPPLY — 75 items
ADH SKN CLS APL DERMABOND .7 (GAUZE/BANDAGES/DRESSINGS) ×1
APL SKNCLS STERI-STRIP NONHPOA (GAUZE/BANDAGES/DRESSINGS) ×1
BAG COUNTER SPONGE SURGICOUNT (BAG) ×4 IMPLANT
BAG SPNG CNTER NS LX DISP (BAG) ×2
BASKET BONE COLLECTION (BASKET) ×2 IMPLANT
BENZOIN TINCTURE PRP APPL 2/3 (GAUZE/BANDAGES/DRESSINGS) ×2 IMPLANT
BLADE CLIPPER SURG (BLADE) IMPLANT
BLADE SURG 11 STRL SS (BLADE) ×2 IMPLANT
BONE VIVIGEN FORMABLE 5.4CC (Bone Implant) ×2 IMPLANT
BUR CUTTER 7.0 ROUND (BURR) ×2 IMPLANT
BUR MATCHSTICK NEURO 3.0 LAGG (BURR) ×2 IMPLANT
CANISTER SUCT 3000ML PPV (MISCELLANEOUS) ×2 IMPLANT
CAP LOCKING THREADED (Cap) ×8 IMPLANT
CARTRIDGE OIL MAESTRO DRILL (MISCELLANEOUS) ×1 IMPLANT
CNTNR URN SCR LID CUP LEK RST (MISCELLANEOUS) ×1 IMPLANT
CONT SPEC 4OZ STRL OR WHT (MISCELLANEOUS) ×2
COVER BACK TABLE 60X90IN (DRAPES) ×2 IMPLANT
DECANTER SPIKE VIAL GLASS SM (MISCELLANEOUS) ×2 IMPLANT
DERMABOND ADVANCED (GAUZE/BANDAGES/DRESSINGS) ×1
DERMABOND ADVANCED .7 DNX12 (GAUZE/BANDAGES/DRESSINGS) ×1 IMPLANT
DIFFUSER DRILL AIR PNEUMATIC (MISCELLANEOUS) ×2 IMPLANT
DRAPE C-ARM 42X72 X-RAY (DRAPES) ×4 IMPLANT
DRAPE C-ARMOR (DRAPES) IMPLANT
DRAPE HALF SHEET 40X57 (DRAPES) IMPLANT
DRAPE LAPAROTOMY 100X72X124 (DRAPES) ×2 IMPLANT
DRAPE SURG 17X23 STRL (DRAPES) ×2 IMPLANT
DRSG OPSITE 4X5.5 SM (GAUZE/BANDAGES/DRESSINGS) ×2 IMPLANT
DRSG OPSITE POSTOP 4X6 (GAUZE/BANDAGES/DRESSINGS) ×2 IMPLANT
DURAPREP 26ML APPLICATOR (WOUND CARE) ×2 IMPLANT
ELECT REM PT RETURN 9FT ADLT (ELECTROSURGICAL) ×2
ELECTRODE REM PT RTRN 9FT ADLT (ELECTROSURGICAL) ×1 IMPLANT
EVACUATOR 3/16  PVC DRAIN (DRAIN)
EVACUATOR 3/16 PVC DRAIN (DRAIN) IMPLANT
GAUZE 4X4 16PLY ~~LOC~~+RFID DBL (SPONGE) ×2 IMPLANT
GAUZE SPONGE 4X4 12PLY STRL (GAUZE/BANDAGES/DRESSINGS) ×2 IMPLANT
GLOVE EXAM NITRILE XL STR (GLOVE) IMPLANT
GLOVE SRG 8 PF TXTR STRL LF DI (GLOVE) ×2 IMPLANT
GLOVE SURG ENC MOIS LTX SZ7 (GLOVE) ×2 IMPLANT
GLOVE SURG ENC MOIS LTX SZ8 (GLOVE) ×4 IMPLANT
GLOVE SURG MICRO LTX SZ7.5 (GLOVE) ×4 IMPLANT
GLOVE SURG UNDER LTX SZ8.5 (GLOVE) ×4 IMPLANT
GLOVE SURG UNDER POLY LF SZ7 (GLOVE) ×2 IMPLANT
GLOVE SURG UNDER POLY LF SZ7.5 (GLOVE) ×4 IMPLANT
GLOVE SURG UNDER POLY LF SZ8 (GLOVE) ×4
GOWN STRL REUS W/ TWL LRG LVL3 (GOWN DISPOSABLE) IMPLANT
GOWN STRL REUS W/ TWL XL LVL3 (GOWN DISPOSABLE) ×2 IMPLANT
GOWN STRL REUS W/TWL 2XL LVL3 (GOWN DISPOSABLE) IMPLANT
GOWN STRL REUS W/TWL LRG LVL3 (GOWN DISPOSABLE)
GOWN STRL REUS W/TWL XL LVL3 (GOWN DISPOSABLE) ×4
GRAFT BONE PROTEIOS LRG 5CC (Orthopedic Implant) ×2 IMPLANT
KIT BASIN OR (CUSTOM PROCEDURE TRAY) ×2 IMPLANT
KIT TURNOVER KIT B (KITS) ×2 IMPLANT
MILL MEDIUM DISP (BLADE) IMPLANT
NEEDLE HYPO 25X1 1.5 SAFETY (NEEDLE) ×2 IMPLANT
NS IRRIG 1000ML POUR BTL (IV SOLUTION) ×4 IMPLANT
OIL CARTRIDGE MAESTRO DRILL (MISCELLANEOUS) ×2
PACK LAMINECTOMY NEURO (CUSTOM PROCEDURE TRAY) ×2 IMPLANT
PAD ARMBOARD 7.5X6 YLW CONV (MISCELLANEOUS) ×4 IMPLANT
ROD 40MM SPINAL (Rod) ×2 IMPLANT
ROD SPINAL 35MM (Rod) ×2 IMPLANT
SCREW PA THRD CREO TULIP 5.5X4 (Head) ×8 IMPLANT
SHAFT CREO 30MM (Neuro Prosthesis/Implant) ×8 IMPLANT
SPACER SUSTAIN RT 12 8D 9X26 (Spacer) ×4 IMPLANT
SPONGE SURGIFOAM ABS GEL 100 (HEMOSTASIS) ×2 IMPLANT
SPONGE T-LAP 4X18 ~~LOC~~+RFID (SPONGE) ×2 IMPLANT
STRIP CLOSURE SKIN 1/2X4 (GAUZE/BANDAGES/DRESSINGS) ×2 IMPLANT
SUT VIC AB 0 CT1 18XCR BRD8 (SUTURE) ×2 IMPLANT
SUT VIC AB 0 CT1 8-18 (SUTURE) ×4
SUT VIC AB 2-0 CT1 18 (SUTURE) ×2 IMPLANT
SUT VIC AB 4-0 PS2 27 (SUTURE) ×2 IMPLANT
TOWEL GREEN STERILE (TOWEL DISPOSABLE) ×2 IMPLANT
TOWEL GREEN STERILE FF (TOWEL DISPOSABLE) ×2 IMPLANT
TRAY FOLEY MTR SLVR 14FR STAT (SET/KITS/TRAYS/PACK) ×2 IMPLANT
TRAY FOLEY MTR SLVR 16FR STAT (SET/KITS/TRAYS/PACK) IMPLANT
WATER STERILE IRR 1000ML POUR (IV SOLUTION) ×2 IMPLANT

## 2021-05-31 NOTE — Progress Notes (Signed)
K+ 2.6 on Istat.  Dr. Glade Stanford made aware.  Patient stated Dr. Lonie Peak office did have her start potassium supplement and she has been taking it but did not take it the morning of surgery.

## 2021-05-31 NOTE — Evaluation (Signed)
Occupational Therapy Evaluation Patient Details Name: Kristi Daniels MRN: [id number] DOB: 12/30/1975 Today's Date: 05/31/2021    History of Present Illness 45 y.o. female presenting to [facility] for elective L4-5 PLIF on 8/8. PMHx significant for DMII and HTN.   Clinical Impression   PTA patient was living with her spouse and children in a private residence and was independent with ADLs/IADLs without AD. 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 Patient currently presents slightly below baseline level of function demonstrating bed mobility, functional transfers and short-distance functional mobility in hospital room with supervision to Min guard A. Patient able to adjust footwear seated EOB in figure-4 position. OT provided education on spinal precautions, home set-up to maximize safety and independence with self-care tasks, and acquisition/use of AE. Patient expressed verbal understanding but would benefit from further education given progressive lethargy at time of OT evaluation. Patient would benefit from 1 follow-up OT treatment session prior to d/c to further assess adherence to spinal precautions and ensure independence/safety with self-care tasks.     Follow Up Recommendations  No OT follow up    Equipment Recommendations  Other (comment)    Recommendations for Other Services       Precautions / Restrictions Precautions Precautions: Back Precaution Booklet Issued: Yes (comment) Precaution Comments: Able to recall 3/3 back preacutions at conclusion of session; few cues for adherence during functional activities Required Braces or Orthoses: Spinal Brace Spinal Brace: Lumbar corset Restrictions Weight Bearing Restrictions: No      Mobility Bed Mobility Overal bed mobility: Modified Independent             General bed mobility comments: Supine <> EOB with increased time +rail. Good return demo of log rolling technique.    Transfers Overall transfer level: Needs assistance    Transfers: Sit to/from Stand Sit to Stand: Min guard         General transfer comment: Min guard for steadying secondary to reports of mild dizziness. Likely due to recent medication administration.    Balance Overall balance assessment: No apparent balance deficits (not formally assessed)                                         ADL either performed or assessed with clinical judgement   ADL Overall ADL's : Needs assistance/impaired     Grooming: Set up;Sitting               Lower Body Dressing: Min guard;Sit to/from stand Lower Body Dressing Details (indicate cue type and reason): Able to adjust footwear seated EOB in figure-4 position without external assist. Toilet Transfer: Min guard Toilet Transfer Details (indicate cue type and reason): HHA secondary to patient report of mild dizziness likely secondary to recent medication administration.         Functional mobility during ADLs: Min guard       Vision   Vision Assessment?: No apparent visual deficits     Perception     Praxis      Pertinent Vitals/Pain Pain Assessment: 0-10 Pain Score: 8  Pain Location: Low back (incisional) Pain Descriptors / Indicators: Grimacing;Guarding;Sore Pain Intervention(s): Limited activity within patient's tolerance;Monitored during session;Repositioned     Hand Dominance     Extremity/Trunk Assessment Upper Extremity Assessment Upper Extremity Assessment: Overall WFL for tasks assessed   Lower Extremity Assessment Lower Extremity Assessment: Defer to PT evaluation   Cervical / Trunk Assessment  Cervical / Trunk Assessment: Other exceptions Cervical / Trunk Exceptions: s/p spinal surgery   Communication     Cognition Arousal/Alertness: Awake/alert;Lethargic (Initially alert but progressed to lethargic secondary to medicaiton administration.) Behavior During Therapy: WFL for tasks assessed/performed Overall Cognitive Status: Within Functional  Limits for tasks assessed                                     General Comments  Clean, dry dressing at incision with hemovac.    Exercises     Shoulder Instructions      Home Living Family/patient expects to be discharged to:: Private residence Living Arrangements: Spouse/significant other;Children Available Help at Discharge: Family Type of Home: House Home Access: Stairs to enter Secretary/administrator of Steps: 2 Entrance Stairs-Rails: None Home Layout: Two level;Able to live on main level with bedroom/bathroom     Bathroom Shower/Tub: Chief Strategy Officer: Standard     Home Equipment: None          Prior Functioning/Environment Level of Independence: Independent        Comments: I with ADLs/IADLs; works as a PCA        OT Problem List: Pain;Decreased knowledge of use of DME or AE;Decreased knowledge of precautions      OT Treatment/Interventions: Self-care/ADL training;Energy conservation;DME and/or AE instruction;Therapeutic activities;Patient/family education    OT Goals(Current goals can be found in the care plan section) Acute Rehab OT Goals Patient Stated Goal: To return home. OT Goal Formulation: With patient Time For Goal Achievement: 06/14/21 Potential to Achieve Goals: Good ADL Goals Additional ADL Goal #1: Patient will complete ADLs with I and good recall of spinal precautions in prep for safe d/c home. Additional ADL Goal #2: Patient will recall 3/3 spinal precautions in prep for ADLs.  OT Frequency: Other (comment) (1 follow-up OT treatment session prior to d/c)   Barriers to D/C:            Co-evaluation              AM-PAC OT "6 Clicks" Daily Activity     Outcome Measure Help from another person eating meals?: None Help from another person taking care of personal grooming?: A Little Help from another person toileting, which includes using toliet, bedpan, or urinal?: A Little Help from another  person bathing (including washing, rinsing, drying)?: A Little Help from another person to put on and taking off regular upper body clothing?: A Little Help from another person to put on and taking off regular lower body clothing?: A Little 6 Click Score: 19   End of Session Equipment Utilized During Treatment: Back brace Nurse Communication: Mobility status;Other (comment) (Response to treatment)  Activity Tolerance: Patient tolerated treatment well Patient left: in bed;with call bell/phone within reach;with bed alarm set  OT Visit Diagnosis: Muscle weakness (generalized) (M62.81)                Time: 8185-6314 OT Time Calculation (min): 22 min Charges:  OT General Charges $OT Visit: 1 Visit OT Evaluation $OT Eval Low Complexity: 1 Low  Nechemia Chiappetta H. OTR/L Supplemental OT, Department of rehab services [phone]  Roosvelt Churchwell R H. 05/31/2021, 2:34 PM

## 2021-05-31 NOTE — Anesthesia Postprocedure Evaluation (Signed)
Anesthesia Post Note  Patient: Kristi Daniels  Procedure(s) Performed: Posterior Lumbar Interbody Fusion - Lumbar four-Lumbar five - (Back)     Patient location during evaluation: PACU Anesthesia Type: General Level of consciousness: awake and alert Pain management: pain level controlled Vital Signs Assessment: post-procedure vital signs reviewed and stable Respiratory status: spontaneous breathing, nonlabored ventilation, respiratory function stable and patient connected to nasal cannula oxygen Cardiovascular status: blood pressure returned to baseline and stable Postop Assessment: no apparent nausea or vomiting Anesthetic complications: no   No notable events documented.  Last Vitals:  Vitals:   05/31/21 1238 05/31/21 1302  BP: 123/83 (!) 148/86  Pulse: 88 92  Resp: 16 18  Temp: 36.6 C 36.6 C  SpO2: 95% 98%    Last Pain:  Vitals:   05/31/21 1302  TempSrc: Oral  PainSc:                  Kennieth Rad

## 2021-05-31 NOTE — Op Note (Signed)
Preoperative diagnosis: Severe spinal stenosis herniated nucleus pulposus and slight grade 1 spondylolisthesis L4-5 with bilateral L4-L5 radiculopathies worse on the left  Postoperative diagnosis: Same  Procedure: #1 decompressive laminectomy L4-5 with complete facetectomies and radical foraminotomies of the L4 and L5 nerve root in excess and requiring more work than would be needed with a standard interbody fusion.  2.  Posterior lumbar interbody fusion utilizing the globus titanium cages packed with locally harvested autograft mixed with vivigen and Proteus  3.  Cortical screw fixation utilizing globus Creo amp modular cortical screw set L4-5  Surgeon: Jillyn Hidden Angalina Ante  Assistant: Hoyt Koch  Anesthesia: General  EBL: Minimal  HPI: 45 year old female progressive worsening back and bilateral hip and leg pain worse on the left refractory to all forms of conservative treatment imaging revealed severe spinal stenosis and slight listhesis with transitional anatomy at L4-5.  Due to patient's progression of clinical syndrome imaging findings and failed conservative treatment I recommended decompression stabilization procedure at L4-5.  I extensively reviewed the risks and benefits of the operation with her as well as perioperative course expectations of outcome and alternatives to surgery and she understood and agreed to proceed forward.  Operative procedure: Patient was brought into the OR was Duson general anesthesia positioned prone the Wilson frame back was prepped and draped in routine sterile fashion.  Preoperative x-ray localized the appropriate level so after infiltration of 10 cc lidocaine with epi midline incision was made and Bovie electrocautery was used to take the take down the subcutaneous tissue and subperiosteal dissection was carried out on the lamina of L4-5 confirmed by intraoperative x-ray.  Then the spinous process at L4 was removed central decompression was begun facets were noted  markedly hypertrophy causing severe hourglass compression of thecal sac and there was extensive mount of dense inflammatory tissue causing marked hypertrophy of the ligamentum flavum and adherence to the thecal sac.  This was all dissected away complete medial facetectomies were performed and radical foraminotomies of the L4 and L5 nerve roots there was extensive mount of spurring coming off the superior tickling facet causing stenosis of both L4 nerve roots especially this was all removed aggressive under biting of both supra reticulating facets gained access lateral margin disc base.  Disc base was then cleaned out bilaterally and utilizing sequential distraction disc base cleaned out decompression was carried out with discectomy and endplates were prepared.  Extensive mount of disc material was removed off of the ventral thecal sac and both L4 and L5 nerve roots.  Then cages were inserted under fluoroscopy an extensive mount of autograft mixed packed centrally then fluoroscopy also was used to place bilateral cortical screws which were first placed with excellent purchase.  All the foramina were reinspected to confirm patency heads were assembled screws were advanced rods were placed everything was tightened down the L4 screw compression at L5.  Wounds are copiously irrigated 6 hemostasis was maintained I reinspected the foramina lay Gelfoam in the dura injected Exparel in the fascia and placed a medium Hemovac drain.  Wound was copiously irrigated meticulous hemostasis was maintained the wound was then reapproximated with interrupted Vicryl and a running 4 subcuticular Dermabond benzoin Steri-Strips and a sterile dressing was applied and patient recovery room in stable condition.  At the end the case all needle count sponge counts were correct.

## 2021-05-31 NOTE — Transfer of Care (Signed)
Immediate Anesthesia Transfer of Care Note  Patient: Kristi Daniels  Procedure(s) Performed: Posterior Lumbar Interbody Fusion - Lumbar four-Lumbar five - (Back)  Patient Location: PACU  Anesthesia Type:General  Level of Consciousness: awake and drowsy  Airway & Oxygen Therapy: Patient Spontanous Breathing  Post-op Assessment: Report given to RN and Post -op Vital signs reviewed and stable  Post vital signs: Reviewed and stable  Last Vitals:  Vitals Value Taken Time  BP 138/78 05/31/21 1108  Temp    Pulse 99 05/31/21 1110  Resp 20 05/31/21 1110  SpO2 94 % 05/31/21 1110  Vitals shown include unvalidated device data.  Last Pain:  Vitals:   05/31/21 0643  TempSrc:   PainSc: 8       Patients Stated Pain Goal: 6 (05/24/21 0919)  Complications: No notable events documented.

## 2021-05-31 NOTE — H&P (Signed)
Kristi Daniels is an 45 y.o. female.   Chief Complaint: Back and left great and right leg pain HPI: 45 year old female with progressive worsening back bilateral hip and leg pain worse on the left.  Work-up revealed severe spinal stenosis with transitional anatomy at L4-5 with broad-based disc herniation marked facet arthropathy.  Patient failed all forms conservative treatment with steroid injections anti-inflammatories physical therapy and time.  Due to patient progression of clinical syndrome imaging findings and failed conservative treatment I recommended decompressive laminectomy interbody fusions at L4-5.  I extensively went over the risks and benefits of that procedure with her as well as perioperative course expectations of outcome and alternatives of surgery and she understood and agreed to proceed forward.  Past Medical History:  Diagnosis Date   Arthritis    "in back"   Diabetes mellitus without complication (HCC)    type 2   Hypertension     Past Surgical History:  Procedure Laterality Date   JOINT REPLACEMENT     elbow    TUBAL LIGATION Bilateral 2004    Family History  Problem Relation Age of Onset   Diabetes Mother    Hypertension Mother    Social History:  reports that she quit smoking about 2 months ago. Her smoking use included cigarettes. She smoked an average of .5 packs per day. She has never used smokeless tobacco. She reports previous alcohol use. She reports that she does not use drugs.  Allergies: No Known Allergies  Medications Prior to Admission  Medication Sig Dispense Refill   amLODipine (NORVASC) 10 MG tablet Take 1 tablet (10 mg total) by mouth daily. 30 tablet 0   buPROPion (WELLBUTRIN SR) 150 MG 12 hr tablet Take 150 mg by mouth 2 (two) times daily.     cyclobenzaprine (FLEXERIL) 10 MG tablet Take 10 mg by mouth 3 (three) times daily as needed for muscle spasms.     empagliflozin (JARDIANCE) 10 MG TABS tablet Take by mouth daily.     gabapentin  (NEURONTIN) 800 MG tablet Take 800 mg by mouth 3 (three) times daily as needed (pain).     meloxicam (MOBIC) 15 MG tablet Take 15 mg by mouth daily.     simvastatin (ZOCOR) 5 MG tablet Take 5 mg by mouth daily.     TRULICITY 0.75 MG/0.5ML SOPN Inject 0.75 mg into the skin every Monday.     venlafaxine XR (EFFEXOR-XR) 75 MG 24 hr capsule Take 75 mg by mouth daily.     methocarbamol (ROBAXIN) 500 MG tablet Take 1 tablet (500 mg total) by mouth 2 (two) times daily. (Patient not taking: No sig reported) 20 tablet 0   predniSONE (STERAPRED UNI-PAK 21 TAB) 10 MG (21) TBPK tablet 6 tabs for 1 day, then 5 tabs for 1 das, then 4 tabs for 1 day, then 3 tabs for 1 day, 2 tabs for 1 day, then 1 tab for 1 day (Patient not taking: No sig reported) 21 tablet 0    Results for orders placed or performed during the hospital encounter of 05/31/21 (from the past 48 hour(s))  Glucose, capillary     Status: Abnormal   Collection Time: 05/31/21  6:11 AM  Result Value Ref Range   Glucose-Capillary 144 (H) 70 - 99 mg/dL    Comment: Glucose reference range applies only to samples taken after fasting for at least 8 hours.  ABO/Rh     Status: None (Preliminary result)   Collection Time: 05/31/21  6:33 AM  Result Value Ref Range   ABO/RH(D) PENDING   I-STAT, chem 8     Status: Abnormal   Collection Time: 05/31/21  6:39 AM  Result Value Ref Range   Sodium 141 135 - 145 mmol/L   Potassium 2.6 (LL) 3.5 - 5.1 mmol/L   Chloride 101 98 - 111 mmol/L   BUN 9 6 - 20 mg/dL   Creatinine, Ser 0.62 0.44 - 1.00 mg/dL   Glucose, Bld 376 (H) 70 - 99 mg/dL    Comment: Glucose reference range applies only to samples taken after fasting for at least 8 hours.   Calcium, Ion 1.06 (L) 1.15 - 1.40 mmol/L   TCO2 29 22 - 32 mmol/L   Hemoglobin 11.9 (L) 12.0 - 15.0 g/dL   HCT 28.3 (L) 15.1 - 76.1 %   Comment NOTIFIED PHYSICIAN    No results found.  Review of Systems  Neurological:  Positive for numbness.   Blood pressure (!)  149/85, pulse 91, temperature 98.6 F (37 C), temperature source Oral, resp. rate 18, height 5\' 2"  (1.575 m), weight 92.5 kg, last menstrual period 11/27/2019, SpO2 98 %. Physical Exam HENT:     Head: Normocephalic.     Right Ear: Tympanic membrane normal.     Nose: Nose normal.  Eyes:     Pupils: Pupils are equal, round, and reactive to light.  Cardiovascular:     Rate and Rhythm: Normal rate.  Pulmonary:     Effort: Pulmonary effort is normal.  Abdominal:     General: Abdomen is flat.  Musculoskeletal:        General: Normal range of motion.  Skin:    General: Skin is warm.  Neurological:     General: No focal deficit present.     Mental Status: She is alert.     Assessment/Plan 45 year old presents for decompressive laminectomy interbody fusion at L4-5  60, MD 05/31/2021, 7:21 AM

## 2021-05-31 NOTE — Anesthesia Procedure Notes (Signed)
Procedure Name: Intubation Date/Time: 05/31/2021 7:37 AM Performed by: Dorthea Cove, CRNA Pre-anesthesia Checklist: Patient identified, Emergency Drugs available, Suction available and Patient being monitored Patient Re-evaluated:Patient Re-evaluated prior to induction Oxygen Delivery Method: Circle system utilized Preoxygenation: Pre-oxygenation with 100% oxygen Induction Type: IV induction Ventilation: Mask ventilation without difficulty and Oral airway inserted - appropriate to patient size Laryngoscope Size: Mac and 3 Grade View: Grade I Tube type: Oral Tube size: 7.0 mm Number of attempts: 1 Airway Equipment and Method: Stylet and Oral airway Placement Confirmation: ETT inserted through vocal cords under direct vision, positive ETCO2 and breath sounds checked- equal and bilateral Secured at: 20 cm Tube secured with: Tape Dental Injury: Teeth and Oropharynx as per pre-operative assessment

## 2021-06-01 DIAGNOSIS — M48061 Spinal stenosis, lumbar region without neurogenic claudication: Secondary | ICD-10-CM | POA: Diagnosis not present

## 2021-06-01 LAB — GLUCOSE, CAPILLARY: Glucose-Capillary: 120 mg/dL — ABNORMAL HIGH (ref 70–99)

## 2021-06-01 MED ORDER — HYDROCODONE-ACETAMINOPHEN 5-325 MG PO TABS: 1.0000 | ORAL_TABLET | ORAL | 0 refills | Status: AC | PRN

## 2021-06-01 MED ORDER — HYDROCODONE-ACETAMINOPHEN 7.5-325 MG PO TABS
1.0000 | ORAL_TABLET | ORAL | Status: DC | PRN
  Administered 2021-06-01 (×3): 2 via ORAL
  Filled 2021-06-01 (×3): qty 2

## 2021-06-01 MED ORDER — CYCLOBENZAPRINE HCL 10 MG PO TABS: 10.0000 mg | ORAL_TABLET | Freq: Three times a day (TID) | ORAL | 0 refills | Status: DC | PRN

## 2021-06-01 NOTE — Plan of Care (Signed)
Adequately Ready for Discharge 

## 2021-06-01 NOTE — Progress Notes (Signed)
Occupational Therapy Treatment Patient Details Name: Kristi Daniels MRN: [id number] DOB: 26-Nov-1975 Today's Date: 06/01/2021    History of present illness 45 y.o. female presenting to [facility] for elective L4-5 PLIF on 8/8. PMHx significant for DMII and HTN.   OT comments  OT treatment session with focus on self-care re-education with adherence to back precautions. Patient able to recall 3/3 back precautions demonstrating good adherence during ADLs including UB/LB dressing in sitting/standing and grooming standing at sink level. All education provided (written and verbal) and all goals met. OT to sign off at this time. Patient is safe to return home.    Follow Up Recommendations  No OT follow up    Equipment Recommendations  None recommended by OT    Recommendations for Other Services      Precautions / Restrictions Precautions Precautions: Back Precaution Booklet Issued: Yes (comment) Precaution Comments: Able to recall 3/3 back preacutions at conclusion of session; good adherence to back precautions during ADLs Required Braces or Orthoses: Spinal Brace Spinal Brace: Lumbar corset Restrictions Weight Bearing Restrictions: No       Mobility Bed Mobility Overal bed mobility: Modified Independent             General bed mobility comments: Supine <> EOB with increased time. Good adherence to log rolling technique.    Transfers Overall transfer level: Independent                    Balance Overall balance assessment: No apparent balance deficits (not formally assessed)                                         ADL either performed or assessed with clinical judgement   ADL Overall ADL's : Modified independent                                             Vision       Perception     Praxis      Cognition Arousal/Alertness: Awake/alert Behavior During Therapy: WFL for tasks assessed/performed Overall Cognitive Status:  Within Functional Limits for tasks assessed                                          Exercises     Shoulder Instructions       General Comments Clean, dry dressing at incision.    Pertinent Vitals/ Pain       Pain Assessment: 0-10 Pain Score: 5  Pain Location: Low back (incisional) Pain Descriptors / Indicators: Grimacing;Guarding;Sore Pain Intervention(s): Limited activity within patient's tolerance;Monitored during session;Repositioned  Home Living                                          Prior Functioning/Environment              Frequency           Progress Toward Goals  OT Goals(current goals can now be found in the care plan section)  Progress towards OT goals: Progressing toward goals  Acute Rehab OT Goals Patient  Stated Goal: To return home. OT Goal Formulation: With patient Time For Goal Achievement: 06/14/21 Potential to Achieve Goals: Good  Plan All goals met and education completed, patient discharged from OT services    Co-evaluation                 AM-PAC OT "6 Clicks" Daily Activity     Outcome Measure   Help from another person eating meals?: None Help from another person taking care of personal grooming?: A Little Help from another person toileting, which includes using toliet, bedpan, or urinal?: A Little Help from another person bathing (including washing, rinsing, drying)?: A Little Help from another person to put on and taking off regular upper body clothing?: A Little Help from another person to put on and taking off regular lower body clothing?: A Little 6 Click Score: 19    End of Session Equipment Utilized During Treatment: Back brace  OT Visit Diagnosis: Muscle weakness (generalized) (M62.81)   Activity Tolerance Patient tolerated treatment well   Patient Left in bed;with call bell/phone within reach   Nurse Communication Mobility status;Other (comment) (Response to treatment)         Time: 0370-4888 OT Time Calculation (min): 11 min  Charges: OT General Charges $OT Visit: 1 Visit OT Treatments $Self Care/Home Management : 8-22 mins  Destanae H. OTR/L Supplemental OT, Department of rehab services [phone]   Destanae R H. 06/01/2021, 8:00 AM

## 2021-06-01 NOTE — Evaluation (Signed)
Physical Therapy Evaluation and Discharge Patient Details Name: Kristi Daniels MRN: [id number] DOB: 1976/01/30 Today's Date: 06/01/2021   History of Present Illness  Pt is a 45 y/o. female presenting to [facility] for elective L4-5 PLIF on 8/8. PMHx significant for DMII and HTN.  Clinical Impression  Patient evaluated by Physical Therapy with no further acute PT needs identified. All education has been completed and the patient has no further questions. Pt was able to demonstrate transfers and ambulation with gross modified independence and no AD. Pt was educated on precautions, brace application/wearing schedule, appropriate activity progression, and car transfer. See below for any follow-up Physical Therapy or equipment needs. PT is signing off. Thank you for this referral.     Follow Up Recommendations No PT follow up;Supervision - Intermittent    Equipment Recommendations  3in1 (PT)    Recommendations for Other Services       Precautions / Restrictions Precautions Precautions: Back;Fall Precaution Booklet Issued: Yes (comment) Precaution Comments: Reviewed handout and pt was cued for precautions during functional mobility. Required Braces or Orthoses: Spinal Brace Spinal Brace: Lumbar corset;Applied in sitting position Restrictions Weight Bearing Restrictions: No      Mobility  Bed Mobility Overal bed mobility: Modified Independent             General bed mobility comments: Good demonstration of log roll technique. No assist required.    Transfers Overall transfer level: Modified independent Equipment used: None Transfers: Sit to/from Stand           General transfer comment: Increased time but no assist required.  Ambulation/Gait Ambulation/Gait assistance: Modified independent (Device/Increase time) Gait Distance (Feet): 400 Feet Assistive device: None Gait Pattern/deviations: Step-through pattern;Decreased stride length;Trunk flexed Gait velocity:  Decreased Gait velocity interpretation: 1.31 - 2.62 ft/sec, indicative of limited community ambulator General Gait Details: VC's for improved posture and general safety. No overt LOB noted.  Stairs Stairs: Yes Stairs assistance: Supervision Stair Management: One rail Left;Step to pattern;Forwards Number of Stairs: 4 General stair comments: VC's for sequencing and general safety.  Wheelchair Mobility    Modified Rankin (Stroke Patients Only)       Balance Overall balance assessment: Mild deficits observed, not formally tested                                           Pertinent Vitals/Pain Pain Assessment: 0-10 Pain Score: 5  Pain Location: Low back (incisional) Pain Descriptors / Indicators: Grimacing;Sore;Operative site guarding Pain Intervention(s): Limited activity within patient's tolerance;Monitored during session;Repositioned    Home Living Family/patient expects to be discharged to:: Private residence Living Arrangements: Spouse/significant other;Children Available Help at Discharge: Family Type of Home: House Home Access: Stairs to enter Entrance Stairs-Rails: None Entrance Stairs-Number of Steps: 2 Home Layout: Two level;Able to live on main level with bedroom/bathroom Home Equipment: None      Prior Function Level of Independence: Independent         Comments: I with ADLs/IADLs; works as a Special educational needs teacher        Extremity/Trunk Assessment   Upper Extremity Assessment Upper Extremity Assessment: Defer to OT evaluation    Lower Extremity Assessment Lower Extremity Assessment: Generalized weakness (consistent with pre-op diagnosis)    Cervical / Trunk Assessment Cervical / Trunk Assessment: Other exceptions Cervical / Trunk Exceptions: s/p spinal surgery  Communication  Cognition Arousal/Alertness: Awake/alert Behavior During Therapy: WFL for tasks assessed/performed Overall Cognitive Status: Within Functional  Limits for tasks assessed                                        General Comments General comments (skin integrity, edema, etc.): Clean, dry dressing at incision.    Exercises     Assessment/Plan    PT Assessment Patent does not need any further PT services  PT Problem List         PT Treatment Interventions      PT Goals (Current goals can be found in the Care Plan section)  Acute Rehab PT Goals Patient Stated Goal: To return home. PT Goal Formulation: All assessment and education complete, DC therapy    Frequency     Barriers to discharge        Co-evaluation               AM-PAC PT "6 Clicks" Mobility  Outcome Measure Help needed turning from your back to your side while in a flat bed without using bedrails?: None Help needed moving from lying on your back to sitting on the side of a flat bed without using bedrails?: None Help needed moving to and from a bed to a chair (including a wheelchair)?: None Help needed standing up from a chair using your arms (e.g., wheelchair or bedside chair)?: None Help needed to walk in hospital room?: None Help needed climbing 3-5 steps with a railing? : A Little 6 Click Score: 23    End of Session Equipment Utilized During Treatment: Gait belt;Back brace Activity Tolerance: Patient tolerated treatment well Patient left: in bed;with call bell/phone within reach Nurse Communication: Mobility status PT Visit Diagnosis: Unsteadiness on feet (R26.81);Pain Pain - part of body:  (back)    Time: 4401-0272 PT Time Calculation (min) (ACUTE ONLY): 19 min   Charges:   PT Evaluation $PT Eval Low Complexity: 1 Low          Conni Slipper, PT, DPT Acute Rehabilitation Services Pager: [phone] Office: [phone]   Marylynn Pearson 06/01/2021, 10:41 AM

## 2021-06-01 NOTE — Discharge Summary (Signed)
Physician Discharge Summary  Patient ID: Kristi Daniels MRN: [id number] DOB/AGE: [birth year] 45 y.o.  Admit date: 05/31/2021 Discharge date: 06/01/2021  Admission Diagnoses: Severe spinal stenosis herniated nucleus pulposus and slight grade 1 spondylolisthesis L4-5 with bilateral L4-L5 radiculopathies worse on the left     Discharge Diagnoses: same   Discharged Condition: good  Hospital Course: The patient was admitted on 05/31/2021 and taken to the operating room where the patient underwent PLIF L4-5. The patient tolerated the procedure well and was taken to the recovery room and then to the floor in stable condition. The hospital course was routine. There were no complications. The wound remained clean dry and intact. Pt had appropriate back soreness. No complaints of leg pain or new N/T/W. The patient remained afebrile with stable vital signs, and tolerated a regular diet. The patient continued to increase activities, and pain was well controlled with oral pain medications.   Consults: None  Significant Diagnostic Studies:  Results for orders placed or performed during the hospital encounter of 05/31/21  Hemoglobin A1c per protocol  Result Value Ref Range   Hgb A1c MFr Bld 6.5 (H) 4.8 - 5.6 %   Mean Plasma Glucose 139.85 mg/dL  CBC per protocol  Result Value Ref Range   WBC 8.8 4.0 - 10.5 K/uL   RBC 5.03 3.87 - 5.11 MIL/uL   Hemoglobin 12.5 12.0 - 15.0 g/dL   HCT 98.3 38.2 - 50.5 %   MCV 78.1 (L) 80.0 - 100.0 fL   MCH 24.9 (L) 26.0 - 34.0 pg   MCHC 31.8 30.0 - 36.0 g/dL   RDW 39.7 (H) 67.3 - 41.9 %   Platelets 328 150 - 400 K/uL   nRBC 0.0 0.0 - 0.2 %  Basic metabolic panel per protocol  Result Value Ref Range   Sodium 140 135 - 145 mmol/L   Potassium 2.7 (LL) 3.5 - 5.1 mmol/L   Chloride 102 98 - 111 mmol/L   CO2 29 22 - 32 mmol/L   Glucose, Bld 105 (H) 70 - 99 mg/dL   BUN 5 (L) 6 - 20 mg/dL   Creatinine, Ser 3.79 0.44 - 1.00 mg/dL   Calcium 9.0 8.9 - 02.4 mg/dL   GFR,  Estimated >09 >73 mL/min   Anion gap 9 5 - 15  Glucose, capillary  Result Value Ref Range   Glucose-Capillary 144 (H) 70 - 99 mg/dL  Glucose, capillary  Result Value Ref Range   Glucose-Capillary 176 (H) 70 - 99 mg/dL  Glucose, capillary  Result Value Ref Range   Glucose-Capillary 189 (H) 70 - 99 mg/dL  Glucose, capillary  Result Value Ref Range   Glucose-Capillary 174 (H) 70 - 99 mg/dL   Comment 1 Notify RN    Comment 2 Document in Chart   Glucose, capillary  Result Value Ref Range   Glucose-Capillary 120 (H) 70 - 99 mg/dL   Comment 1 Notify RN    Comment 2 Document in Chart   I-STAT, chem 8  Result Value Ref Range   Sodium 141 135 - 145 mmol/L   Potassium 2.6 (LL) 3.5 - 5.1 mmol/L   Chloride 101 98 - 111 mmol/L   BUN 9 6 - 20 mg/dL   Creatinine, Ser 5.32 0.44 - 1.00 mg/dL   Glucose, Bld 992 (H) 70 - 99 mg/dL   Calcium, Ion 4.26 (L) 1.15 - 1.40 mmol/L   TCO2 29 22 - 32 mmol/L   Hemoglobin 11.9 (L) 12.0 - 15.0 g/dL   HCT  35.0 (L) 36.0 - 46.0 %   Comment NOTIFIED PHYSICIAN   Type and screen [facility]  Result Value Ref Range   ABO/RH(D) B POS    Antibody Screen NEG    Sample Expiration 06/07/2021,2359    Extend sample reason      NO TRANSFUSIONS OR PREGNANCY IN THE PAST 3 MONTHS Performed at [facility], [address], [place], [place] [address]   ABO/Rh  Result Value Ref Range   ABO/RH(D)      B POS Performed at [facility], [address], [place], [place] [address]     DG Lumbar Spine 2-3 Views  Result Date: 05/31/2021 CLINICAL DATA:  Surgery, elective Z41.9 (ICD-10-CM). Additional history provided by technologist: L4-5 PLIF. Provided fluoroscopy time 49 seconds (54.13 mGy). EXAM: LUMBAR SPINE - 2-3 VIEW; DG C-ARM 1-60 MIN COMPARISON:  Lumbar spine MRI 06/20/2020. FINDINGS: AP and lateral view intraoperative fluoroscopic images of the lumbosacral spine are submitted, 2 images total. Redemonstrated transitional lumbosacral anatomy.  Spinal numbering will remain consistent with that utilized on the lumbar spine MRI of 06/20/2020. By this convention, there is partial sacralization of the L5 vertebra. On the provided images, bilateral pedicle screws are present at L4 and L5. Vertical interconnecting rods were not present at the time the images were taken. Interbody device(s) also present at L4-L5. IMPRESSION: Two intraoperative fluoroscopic images of the lumbosacral spine from L4-L5 posterior fusion. Electronically Signed   By: Jackey Loge DO   On: 05/31/2021 12:47   DG C-Arm 1-60 Min  Result Date: 05/31/2021 CLINICAL DATA:  Surgery, elective Z41.9 (ICD-10-CM). Additional history provided by technologist: L4-5 PLIF. Provided fluoroscopy time 49 seconds (54.13 mGy). EXAM: LUMBAR SPINE - 2-3 VIEW; DG C-ARM 1-60 MIN COMPARISON:  Lumbar spine MRI 06/20/2020. FINDINGS: AP and lateral view intraoperative fluoroscopic images of the lumbosacral spine are submitted, 2 images total. Redemonstrated transitional lumbosacral anatomy. Spinal numbering will remain consistent with that utilized on the lumbar spine MRI of 06/20/2020. By this convention, there is partial sacralization of the L5 vertebra. On the provided images, bilateral pedicle screws are present at L4 and L5. Vertical interconnecting rods were not present at the time the images were taken. Interbody device(s) also present at L4-L5. IMPRESSION: Two intraoperative fluoroscopic images of the lumbosacral spine from L4-L5 posterior fusion. Electronically Signed   By: Jackey Loge DO   On: 05/31/2021 12:47    Antibiotics:  Anti-infectives (From admission, onward)    Start     Dose/Rate Route Frequency Ordered Stop   05/31/21 1530  ceFAZolin (ANCEF) IVPB 2g/100 mL premix        2 g 200 mL/hr over 30 Minutes Intravenous Every 8 hours 05/31/21 1243 06/02/21 1529   05/31/21 0632  ceFAZolin (ANCEF) 2-4 GM/100ML-% IVPB       Note to Pharmacy: Clovis Cao   : cabinet override      05/31/21  0632 05/31/21 0833   05/31/21 0630  ceFAZolin (ANCEF) IVPB 2g/100 mL premix        2 g 200 mL/hr over 30 Minutes Intravenous On call to O.R. 05/31/21 9528 05/31/21 0744       Discharge Exam: Blood pressure 136/83, pulse 91, temperature 98.1 F (36.7 C), temperature source Oral, resp. rate 18, height 5\' 2"  (1.575 m), weight 92.5 kg, last menstrual period 11/27/2019, SpO2 100 %. Neurologic: Grossly normal Ambulating and voiding well, incision cdi   Discharge Medications:   Allergies as of 06/01/2021  No Known Allergies      Medication List     STOP taking these medications    methocarbamol 500 MG tablet Commonly known as: ROBAXIN   predniSONE 10 MG (21) Tbpk tablet Commonly known as: STERAPRED UNI-PAK 21 TAB       TAKE these medications    amLODipine 10 MG tablet Commonly known as: NORVASC Take 1 tablet (10 mg total) by mouth daily.   buPROPion 150 MG 12 hr tablet Commonly known as: WELLBUTRIN SR Take 150 mg by mouth 2 (two) times daily.   cyclobenzaprine 10 MG tablet Commonly known as: FLEXERIL Take 1 tablet (10 mg total) by mouth 3 (three) times daily as needed for muscle spasms.   empagliflozin 10 MG Tabs tablet Commonly known as: JARDIANCE Take by mouth daily.   gabapentin 800 MG tablet Commonly known as: NEURONTIN Take 800 mg by mouth 3 (three) times daily as needed (pain).   HYDROcodone-acetaminophen 5-325 MG tablet Commonly known as: NORCO/VICODIN Take 1 tablet by mouth every 4 (four) hours as needed for moderate pain.   meloxicam 15 MG tablet Commonly known as: MOBIC Take 15 mg by mouth daily.   simvastatin 5 MG tablet Commonly known as: ZOCOR Take 5 mg by mouth daily.   Trulicity 0.75 MG/0.5ML Sopn Generic drug: Dulaglutide Inject 0.75 mg into the skin every Monday.   venlafaxine XR 75 MG 24 hr capsule Commonly known as: EFFEXOR-XR Take 75 mg by mouth daily.        Disposition: home   Final Dx: PLIF L4-5  Discharge Instructions       Remove dressing in 72 hours   Complete by: As directed    Call MD for:  difficulty breathing, headache or visual disturbances   Complete by: As directed    Call MD for:  hives   Complete by: As directed    Call MD for:  persistant dizziness or light-headedness   Complete by: As directed    Call MD for:  persistant nausea and vomiting   Complete by: As directed    Call MD for:  redness, tenderness, or signs of infection (pain, swelling, redness, odor or green/yellow discharge around incision site)   Complete by: As directed    Call MD for:  severe uncontrolled pain   Complete by: As directed    Call MD for:  temperature >100.4   Complete by: As directed    Diet - low sodium heart healthy   Complete by: As directed    Driving Restrictions   Complete by: As directed    No driving for 2 weeks, no riding in the car for 1 week   Increase activity slowly   Complete by: As directed    Lifting restrictions   Complete by: As directed    No lifting more than 8 lbs          Signed: Tiana Loft Fabiano Ginley 06/01/2021, 8:40 AM

## 2021-12-13 ENCOUNTER — Other Ambulatory Visit: Payer: Self-pay | Admitting: Family Medicine

## 2021-12-13 DIAGNOSIS — Z1231 Encounter for screening mammogram for malignant neoplasm of breast: Secondary | ICD-10-CM

## 2021-12-29 ENCOUNTER — Ambulatory Visit
Admission: RE | Admit: 2021-12-29 | Discharge: 2021-12-29 | Disposition: A | Discharge status: Home / Self Care | Payer: Medicaid Other | Source: Ambulatory Visit | Attending: Family Medicine | Admitting: Family Medicine

## 2021-12-29 DIAGNOSIS — Z1231 Encounter for screening mammogram for malignant neoplasm of breast: Secondary | ICD-10-CM

## 2022-08-22 IMAGING — XA Imaging study
2 series · 2 of 2 positions shown · non-contrast
Comparison: none

CLINICAL DATA: Lumbosacral spondylosis without myelopathy. Chronic
low back pain radiating into both legs, worse on the left a day. No
prior surgery or injections. Moderate stenosis at L4-L5.

[Series 1: ortho standard · 1 of 1 slices shown (1 of 2)]
[im 1/1]
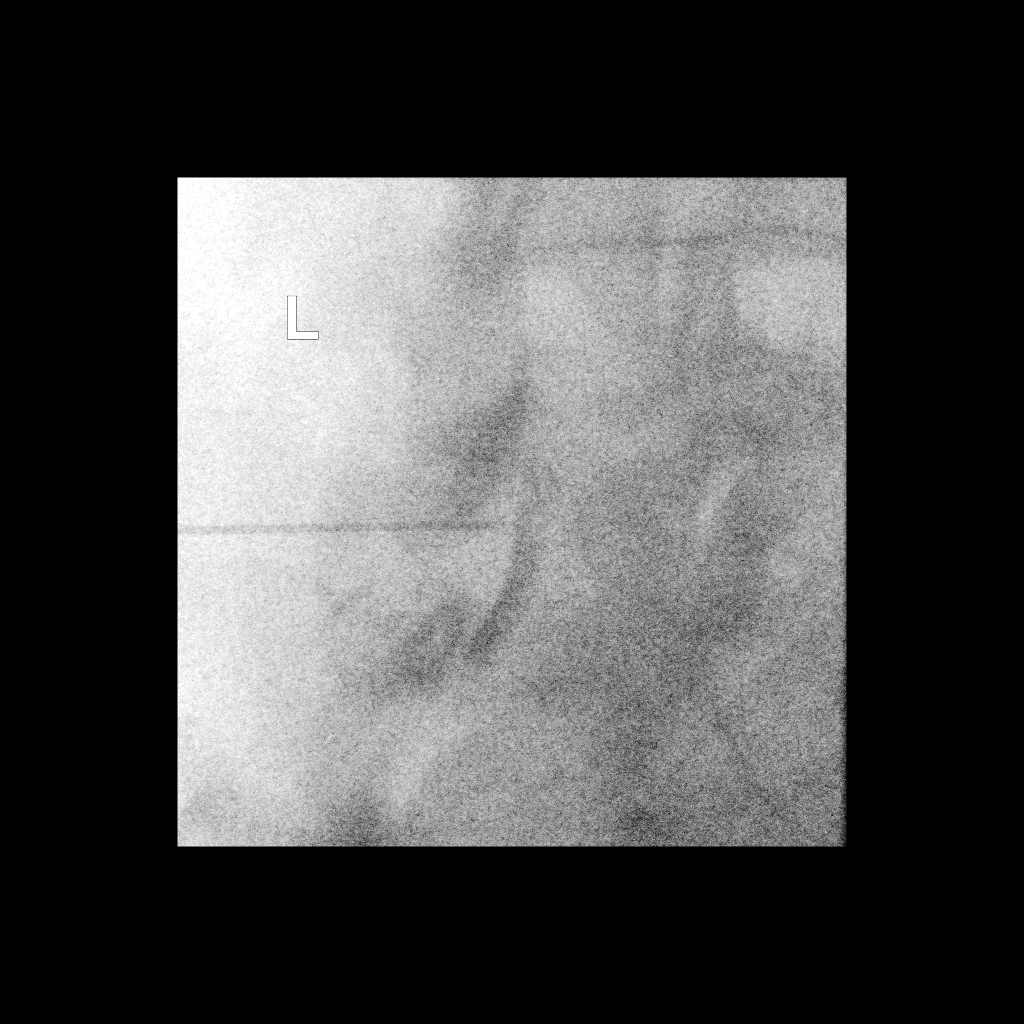

[Series 2: ortho standard · 1 of 1 slices shown (2 of 2)]
[im 1/1]
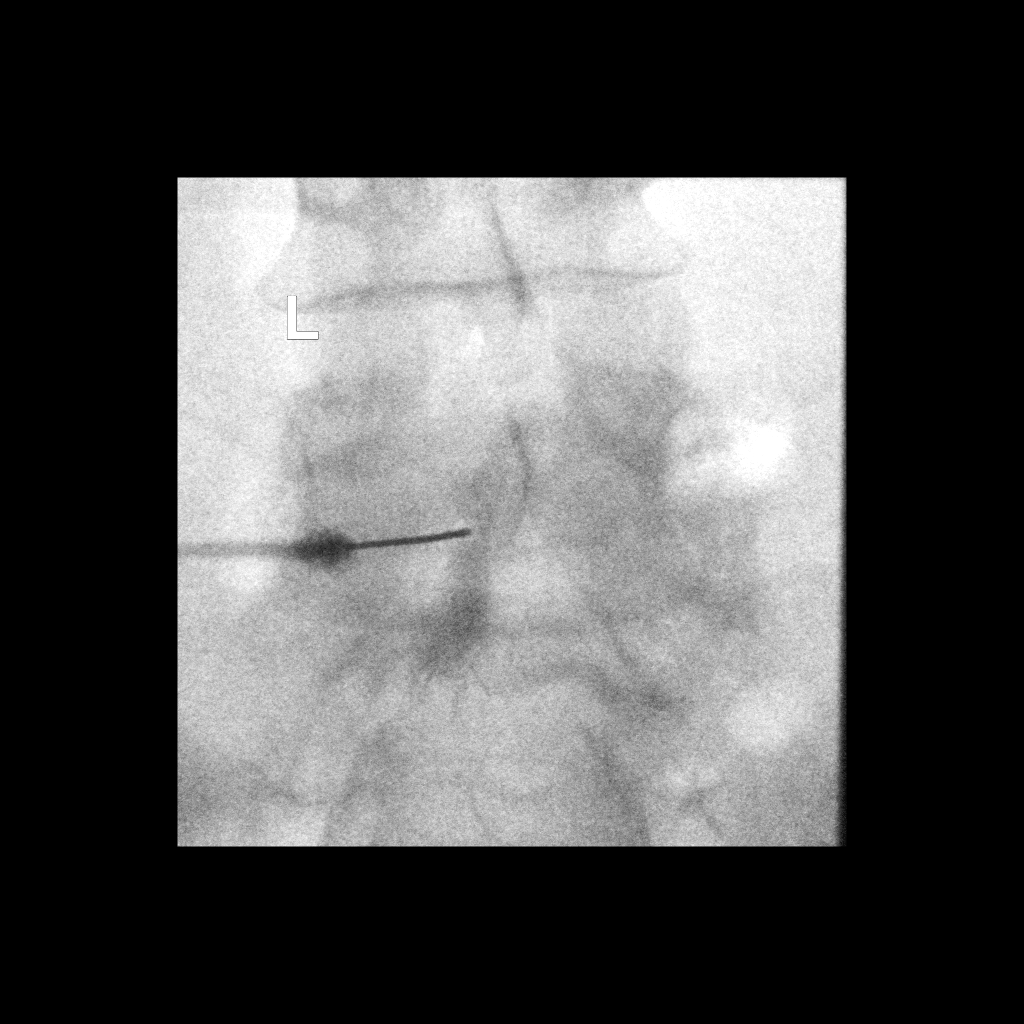

[2 of 2 positions shown; findings below may reference images not displayed]

FLUOROSCOPY TIME:  Radiation Exposure Index (as provided by the
fluoroscopic device): 2.6 mGy

Fluoroscopy Time:  16 seconds

Number of Acquired Images:  0

PROCEDURE:
The procedure, risks, benefits, and alternatives were explained to
the patient. Questions regarding the procedure were encouraged and
answered. The patient understands and consents to the procedure.

LUMBAR EPIDURAL INJECTION:

An interlaminar approach was performed on the left at L4-L5. The
overlying skin was cleansed and anesthetized. A 6 inch 20 gauge
epidural needle was advanced using loss-of-resistance technique.

DIAGNOSTIC EPIDURAL INJECTION:

Injection of Isovue-M 200 shows a good epidural pattern with spread
above and below the level of needle placement, primarily on the
left. No vascular opacification is seen.

THERAPEUTIC EPIDURAL INJECTION:

120 mg of Depo-Medrol mixed with 3 mL of 1% lidocaine were
instilled. The procedure was well-tolerated, and the patient was
discharged thirty minutes following the injection in good condition.

COMPLICATIONS:
None immediate.
IMPRESSION: Technically successful interlaminar epidural injection on the left
at L4-L5.

## 2023-07-21 IMAGING — RF DG C-ARM 1-60 MIN
1 series · 2 of 2 positions shown · non-contrast
Comparison: Lumbar spine MRI 06/20/2020.

CLINICAL DATA: Surgery, elective PXG.Q ([id number]-CM). Additional
history provided by [HOSPITAL]-5 PLIF. Provided fluoroscopy
time 49 seconds (54.13 mGy).

EXAM:
LUMBAR SPINE - 2-3 VIEW; DG C-ARM 1-60 MIN

[Series 1: run · 2 of 2 slices shown]
[im 1/2]
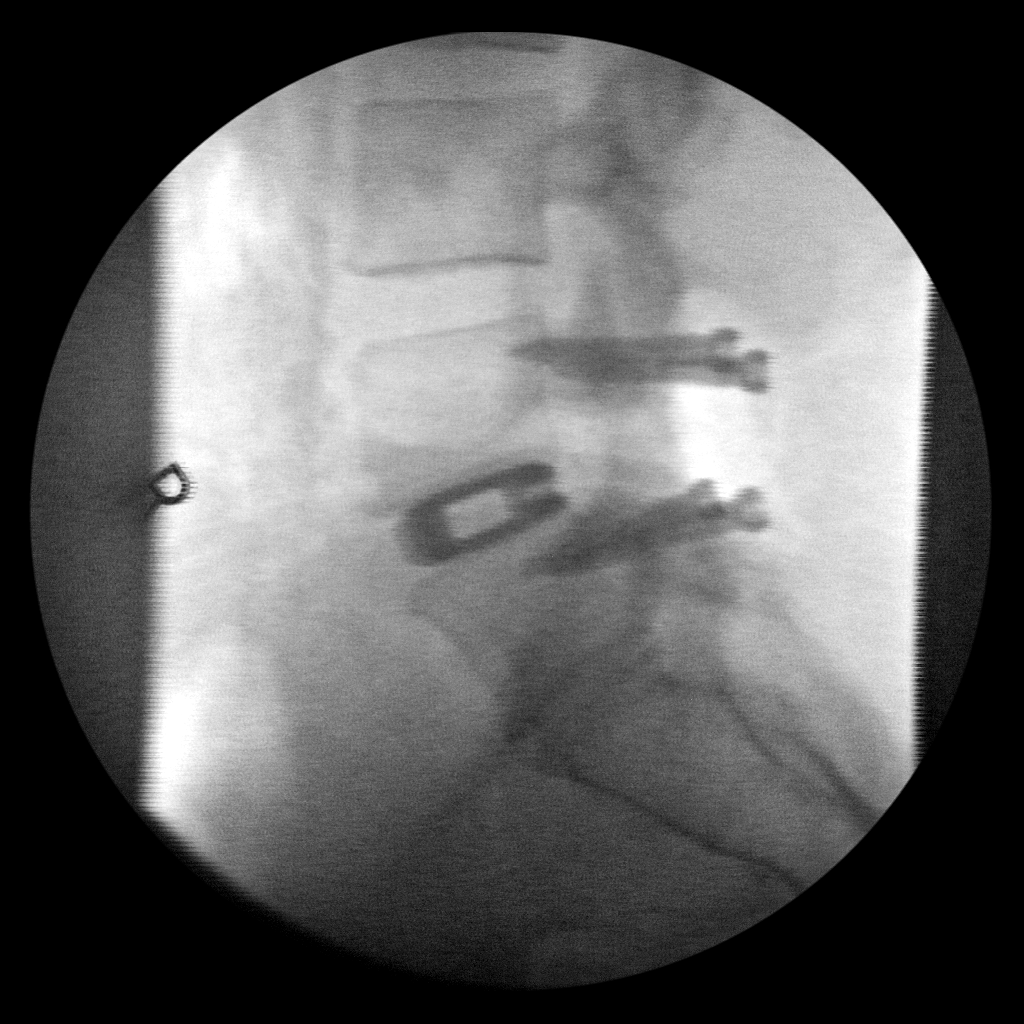
[im 2/2]
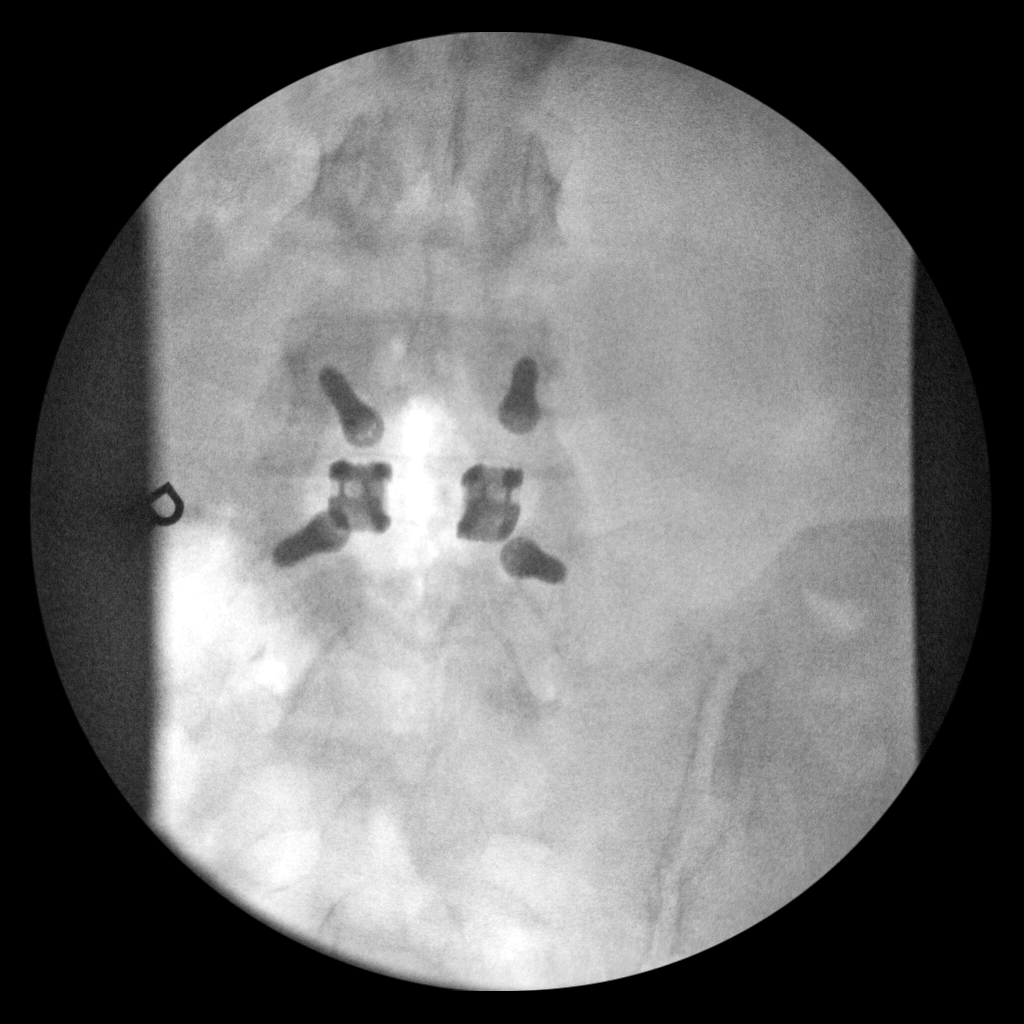

[2 of 2 positions shown; findings below may reference images not displayed]

FINDINGS: AP and lateral view intraoperative fluoroscopic images of the
lumbosacral spine are submitted, 2 images total. Redemonstrated
transitional lumbosacral anatomy. Spinal numbering will remain
consistent with that utilized on the lumbar spine MRI of 06/20/2020.
By this convention, there is partial sacralization of the L5
vertebra. On the provided images, bilateral pedicle screws are
present at L4 and L5. Vertical interconnecting rods were not present
at the time the images were taken. Interbody device(s) also present
at L4-L5.
IMPRESSION: Two intraoperative fluoroscopic images of the lumbosacral spine from
L4-L5 posterior fusion.

## 2024-03-08 ENCOUNTER — Encounter: Payer: Self-pay | Admitting: Physical Therapy

## 2024-03-08 ENCOUNTER — Ambulatory Visit: Attending: Student | Admitting: Physical Therapy

## 2024-03-08 ENCOUNTER — Other Ambulatory Visit: Payer: Self-pay

## 2024-03-08 DIAGNOSIS — M6281 Muscle weakness (generalized): Secondary | ICD-10-CM | POA: Diagnosis present

## 2024-03-08 DIAGNOSIS — M542 Cervicalgia: Secondary | ICD-10-CM | POA: Insufficient documentation

## 2024-03-08 NOTE — Therapy (Addendum)
 OUTPATIENT PHYSICAL THERAPY SHOULDER EVALUATION   Patient Name: Kristi Daniels MRN: [id number] DOB:January 10, 1976, 48 y.o., female Today's Date: 03/08/2024   PT End of Session - 03/08/24 1041     Visit Number 1    Number of Visits --   1-2x/week   Date for PT Re-Evaluation 05/03/24    Authorization Type [employer] - NDI    PT Start Time 0800    PT Stop Time 0840    PT Time Calculation (min) 40 min             Past Medical History:  Diagnosis Date   Arthritis    "in back"   Diabetes mellitus without complication (HCC)    type 2   Hypertension    Past Surgical History:  Procedure Laterality Date   JOINT REPLACEMENT     elbow    TUBAL LIGATION Bilateral 2004   Patient Active Problem List   Diagnosis Date Noted   Spinal stenosis at L4-L5 level 05/31/2021    PCP: Janifer Meigs, FNP  REFERRING PROVIDER: Jeannette Mills*  THERAPY DIAG:  Cervicalgia - Plan: PT plan of care cert/re-cert  Muscle weakness - Plan: PT plan of care cert/re-cert  REFERRING DIAG: Radiculopathy, cervical region [M54.12]   Rationale for Evaluation and Treatment:  Rehabilitation  SUBJECTIVE:  PERTINENT PAST HISTORY:  Hx of lumbar fusion, DM II,       PRECAUTIONS: Other: lumbar fusion  WEIGHT BEARING RESTRICTIONS No  FALLS:  Has patient fallen in last 6 months? No, Number of falls: 0  MOI/History of condition:  Onset date: ~ 6 months  SUBJECTIVE STATEMENT  Kristi Daniels is a 48 y.o. female who presents to clinic with chief complaint of neck pain and UE radicular sxs.  She also has LE radiculopathy with hx of lumbar fusion.  She was having quite a lot of shoulder blade pain starting about 6 months ago with no inciting event.  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  She is required to do PT before she gets an MRI.  She is having n/t in both UE R>L.  The n/t is variable but occurs most often in her R hand digits 1-3.  Denies gait  disturbance.   Red flags:  denies dizziness, diplopia, drop attack, dysarthria, dysphagia, nystagmus, nausea, and facial numbness  Pain:  Are you having pain? Yes Pain location: "entire spine" NPRS scale:  7/10 to 9/10 Aggravating factors: neck movements, "too much activity" Relieving factors: rest Pain description: constant, sharp, and aching Stage: Chronic 24 hour pattern: worse with activity   Occupation: na  Assistive Device: na  Hand Dominance: R  Patient Goals/Specific Activities: reduce pain   OBJECTIVE:   DIAGNOSTIC FINDINGS:  None recent in epic  GENERAL OBSERVATION: Forward head, rounded shoulders     SENSATION: Light touch: Deficits digits 1-3 R   PALPATION: Significant TTP bil sub occipitals, bil UT, bil LS, cervical paraspinals  Cervical ROM  ROM ROM  (Eval)  Flexion 20*  Extension 20*  Right lateral flexion 40*  Left lateral flexion 40*  Right rotation 40*  Left rotation 40*  Flexion rotation (normal is 30 degrees)   Flexion rotation (normal is 30 degrees)     (Blank rows = not tested, N = WNL, * = concordant pain)  UPPER EXTREMITY MMT:  MMT Right (Eval) Left (Eval)  Shoulder flexion    Shoulder abduction (C5) c c  Shoulder ER    Shoulder IR  Middle trapezius    Lower trapezius    Shoulder extension    Grip strength 50 psi 50 psi  Shoulder shrug (C4)    Elbow flexion (C6) c c  Elbow ext (C7) D c  Thumb ext (C8) c c  Finger abd (T1) c c  Grossly     (Blank rows = not tested, score listed is out of 5 possible points.  N = WNL, D = diminished, C = clear for gross weakness with myotome testing, * = concordant pain with testing)   SPECIAL TESTS:  Spurling's (+) for local pain not radicular sxs  JOINT MOBILITY TESTING:  Hypomobile throughout Cx spine  PATIENT SURVEYS:  NDI: 32/50 pts    TODAY'S TREATMENT:  Therapeutic Exercise: Creating, reviewing, and completing below HEP    PATIENT EDUCATION ([place]/HM):  POC,  diagnosis, prognosis, HEP, and outcome measures.  Pt educated via explanation, demonstration, and handout (HEP).  Pt confirms understanding verbally.    HOME EXERCISE PROGRAM: Access Code: [email or web address] URL: [email or web address] Date: 03/08/2024 Prepared by: Lesleigh Rash  Exercises - Seated Cervical Retraction  - 3 x daily - 7 x weekly - 1 sets - 10 reps - 5'' hold - Supine Cervical Retraction with Towel  - 2 x daily - 7 x weekly - 2 sets - 10 reps - 5 second hold - Seated Assisted Cervical Rotation with Towel  - 2-3 x daily - 7 x weekly - 2 sets - 15 reps  Treatment priorities   Eval        Mobility of cx spine        Sub occipital manual/TDN        Periscapular/cervical strengthening        DNF endurance                  ASSESSMENT:  CLINICAL IMPRESSION: Kristi Daniels is a 48 y.o. female who presents to clinic with signs and sxs consistent with chronic neck pain with mobility deficits.  She is hypomobile throughout cx spine and has concurrent muscle tightness and pain.  No overt signs of radiculopathy on exam with the exception of some n/t in digits 1-3 on the R hand.  She will benefit from skilled PT to address relevant deficits and improve comfort in daily activities.    OBJECTIVE IMPAIRMENTS: Pain, cervical ROM, UE strength  ACTIVITY LIMITATIONS: sitting, reading, driving, looking up/down/rotation  PERSONAL FACTORS: See medical history and pertinent history   REHAB POTENTIAL: Good  CLINICAL DECISION MAKING: Evolving/moderate complexity  EVALUATION COMPLEXITY: Moderate   GOALS:   SHORT TERM GOALS: Target date: 04/05/2024   Janki will be >75% HEP compliant to improve carryover between sessions and facilitate independent management of condition  Evaluation: ongoing Goal status: INITIAL   LONG TERM GOALS: Target date: 05/03/2024   Alithia will self report >/= 50% decrease in pain from evaluation to improve function and in daily tasks requiring head  movements  Evaluation/Baseline: 9/10 max pain Goal status: INITIAL   2.  Akane will show a >/= 10 pt improvement in their NDI score (MCID ~20% or 10/50 pts)   Evaluation/Baseline: 32 pts Goal status: INITIAL   3.  Courtnei will improve cervical rotation to >/= 60 degrees to allow improved tolerance to head turns in driving and other dialy activities  Evaluation/Baseline: 40 degrees with pain Goal status: INITIAL   4.  Shailah will report confidence in self management of condition at time of discharge with advanced HEP  Evaluation/Baseline: unable to self manage  Goal status: INITIAL   PLAN: PT FREQUENCY: 1-2x/week  PT DURATION: 8 weeks  PLANNED INTERVENTIONS:  97164- PT Re-evaluation, 97110-Therapeutic exercises, 97530- Therapeutic activity, [id number]- Neuromuscular re-education, 97535- Self Care, 78469- Manual therapy, [id number]- Gait training, [id number]- Aquatic Therapy, [phone]- Electrical stimulation (manual), [id number]- Vasopneumatic device, [id number]- Traction (mechanical), [id number]- Ionotophoresis 4mg /ml Dexamethasone , Taping, Dry Needling, Joint manipulation, and Spinal manipulation.   Yvone Slape PT, DPT 03/08/2024, 10:52 AM

## 2024-03-22 ENCOUNTER — Encounter: Payer: Self-pay | Admitting: Physical Therapy

## 2024-03-22 ENCOUNTER — Ambulatory Visit: Admitting: Physical Therapy

## 2024-03-22 DIAGNOSIS — M542 Cervicalgia: Secondary | ICD-10-CM | POA: Diagnosis not present

## 2024-03-22 DIAGNOSIS — M6281 Muscle weakness (generalized): Secondary | ICD-10-CM

## 2024-03-22 NOTE — Patient Instructions (Signed)

## 2024-03-22 NOTE — Therapy (Signed)
 OUTPATIENT PHYSICAL THERAPY DAILY NOTE   Patient Name: Kristi Daniels MRN: [id number] DOB:1975/11/19, 48 y.o., female Today's Date: 03/22/2024   PT End of Session - 03/22/24 0846     Visit Number 2    Date for PT Re-Evaluation 05/03/24    Authorization Type [employer] - NDI    PT Start Time 0845    PT Stop Time 0926    PT Time Calculation (min) 41 min             Past Medical History:  Diagnosis Date   Arthritis    "in back"   Diabetes mellitus without complication (HCC)    type 2   Hypertension    Past Surgical History:  Procedure Laterality Date   JOINT REPLACEMENT     elbow    TUBAL LIGATION Bilateral 2004   Patient Active Problem List   Diagnosis Date Noted   Spinal stenosis at L4-L5 level 05/31/2021    PCP: Janifer Meigs, FNP  REFERRING PROVIDER: Janifer Meigs, *  THERAPY DIAG:  Cervicalgia  Muscle weakness  REFERRING DIAG: Radiculopathy, cervical region [M54.12]   Rationale for Evaluation and Treatment:  Rehabilitation  SUBJECTIVE:  PERTINENT PAST HISTORY:  Hx of lumbar fusion, DM II,       PRECAUTIONS: Other: lumbar fusion  WEIGHT BEARING RESTRICTIONS No  FALLS:  Has patient fallen in last 6 months? No, Number of falls: 0  MOI/History of condition:  Onset date: ~ 6 months  SUBJECTIVE STATEMENT  03/22/2024: Pt reports that her pain is about the same.  She feels that the HEP is helpful but that the benefit is short lived.    EVAL: Kristi Daniels is a 48 y.o. female who presents to clinic with chief complaint of neck pain and UE radicular sxs.  She also has LE radiculopathy with hx of lumbar fusion.  She was having quite a lot of shoulder blade pain starting about 6 months ago with no inciting event.  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  She is required to do PT before she gets an MRI.  She is having n/t in both UE R>L.  The n/t is variable but occurs most often in her R hand digits 1-3.   Denies gait disturbance.   Red flags:  denies dizziness, diplopia, drop attack, dysarthria, dysphagia, nystagmus, nausea, and facial numbness  Pain:  Are you having pain? Yes Pain location: "entire spine" NPRS scale:  7/10 to 9/10 Aggravating factors: neck movements, "too much activity" Relieving factors: rest Pain description: constant, sharp, and aching Stage: Chronic 24 hour pattern: worse with activity   Occupation: na  Assistive Device: na  Hand Dominance: R  Patient Goals/Specific Activities: reduce pain   OBJECTIVE:   DIAGNOSTIC FINDINGS:  None recent in epic  GENERAL OBSERVATION: Forward head, rounded shoulders     SENSATION: Light touch: Deficits digits 1-3 R   PALPATION: Significant TTP bil sub occipitals, bil UT, bil LS, cervical paraspinals  Cervical ROM  ROM ROM  (Eval)  Flexion 20*  Extension 20*  Right lateral flexion 40*  Left lateral flexion 40*  Right rotation 40*  Left rotation 40*  Flexion rotation (normal is 30 degrees)   Flexion rotation (normal is 30 degrees)     (Blank rows = not tested, N = WNL, * = concordant pain)  UPPER EXTREMITY MMT:  MMT Right (Eval) Left (Eval)  Shoulder flexion    Shoulder abduction (C5) c c  Shoulder  ER    Shoulder IR    Middle trapezius    Lower trapezius    Shoulder extension    Grip strength 50 psi 50 psi  Shoulder shrug (C4)    Elbow flexion (C6) c c  Elbow ext (C7) D c  Thumb ext (C8) c c  Finger abd (T1) c c  Grossly     (Blank rows = not tested, score listed is out of 5 possible points.  N = WNL, D = diminished, C = clear for gross weakness with myotome testing, * = concordant pain with testing)   SPECIAL TESTS:  Spurling's (+) for local pain not radicular sxs  JOINT MOBILITY TESTING:  Hypomobile throughout Cx spine  PATIENT SURVEYS:  NDI: 32/50 pts    TODAY'S TREATMENT:   [facility] Adult PT Treatment  03/22/2024:  Therapeutic Exercise: UT stretch LS stretch - stopped d/t  pain UBE - 3'/3' following TDN Shoulder rolls Corner pec stretch - not tolerated d/t pain   [place]/HM: Instructions on theracane for self TPR and pin and stretch  Manual Therapy  Manual TPR bil UT performed by Hartley Line, DTP Skilled palpation to identify and monitor TPs during TDN  Trigger Point Dry Needling  Initial Treatment: Pt instructed on Dry Needling rational, procedures, and possible side effects. Pt instructed to expect mild to moderate muscle soreness later in the day and/or into the next day.  Pt instructed in methods to reduce muscle soreness. Pt instructed to continue prescribed HEP. Because Dry Needling was performed over or adjacent to a lung field, pt was educated on S/S of pneumothorax and to seek immediate medical attention should they occur.  Patient was educated on signs and symptoms of infection and other risk factors and advised to seek medical attention should they occur.  Patient verbalized understanding of these instructions and education.   Patient Verbal Consent Given: Yes Education Handout Provided: Yes Muscles Treated: bil UT Electrical Stimulation Performed: No Treatment Response/Outcome: twitch       HOME EXERCISE PROGRAM: Access Code: [email or web address] URL: [email or web address] Date: 03/08/2024 Prepared by: Lesleigh Rash  Exercises - Seated Cervical Retraction  - 3 x daily - 7 x weekly - 1 sets - 10 reps - 5'' hold - Supine Cervical Retraction with Towel  - 2 x daily - 7 x weekly - 2 sets - 10 reps - 5 second hold - Seated Assisted Cervical Rotation with Towel  - 2-3 x daily - 7 x weekly - 2 sets - 15 reps  Treatment priorities   Eval        Mobility of cx spine        Sub occipital manual/TDN        Periscapular/cervical strengthening        DNF endurance                  ASSESSMENT:  CLINICAL IMPRESSION:  03/22/2024:  Kristi Daniels tolerated session well with no adverse reaction.  Concentrated on TPR of bil UT combined with  TDN.  Pt reports modest reduction in UT tightness following treatment today.  Pt instructed on theacane usage for self care at home.  Will add in light strengthening next visit.   EVAL: Kristi Daniels is a 48 y.o. female who presents to clinic with signs and sxs consistent with chronic neck pain with mobility deficits.  She is hypomobile throughout cx spine and has concurrent muscle tightness and pain.  No overt signs of radiculopathy on exam with the exception of some  n/t in digits 1-3 on the R hand.  She will benefit from skilled PT to address relevant deficits and improve comfort in daily activities.    OBJECTIVE IMPAIRMENTS: Pain, cervical ROM, UE strength  ACTIVITY LIMITATIONS: sitting, reading, driving, looking up/down/rotation  PERSONAL FACTORS: See medical history and pertinent history   REHAB POTENTIAL: Good  CLINICAL DECISION MAKING: Evolving/moderate complexity  EVALUATION COMPLEXITY: Moderate   GOALS:   SHORT TERM GOALS: Target date: 04/05/2024   Tarrie will be >75% HEP compliant to improve carryover between sessions and facilitate independent management of condition  Evaluation: ongoing Goal status: INITIAL   LONG TERM GOALS: Target date: 05/03/2024   Josalyn will self report >/= 50% decrease in pain from evaluation to improve function and in daily tasks requiring head movements  Evaluation/Baseline: 9/10 max pain Goal status: INITIAL   2.  Shaneya will show a >/= 10 pt improvement in their NDI score (MCID ~20% or 10/50 pts)   Evaluation/Baseline: 32 pts Goal status: INITIAL   3.  Leeana will improve cervical rotation to >/= 60 degrees to allow improved tolerance to head turns in driving and other dialy activities  Evaluation/Baseline: 40 degrees with pain Goal status: INITIAL   4.  Ceylin will report confidence in self management of condition at time of discharge with advanced HEP  Evaluation/Baseline: unable to self manage Goal status:  INITIAL   PLAN: PT FREQUENCY: 1-2x/week  PT DURATION: 8 weeks  PLANNED INTERVENTIONS:  97164- PT Re-evaluation, 97110-Therapeutic exercises, 97530- Therapeutic activity, [id number]- Neuromuscular re-education, 97535- Self Care, [id number]- Manual therapy, [id number]- Gait training, [id number]- Aquatic Therapy, [id number]- Electrical stimulation (manual), [id number]- Vasopneumatic device, [id number]- Traction (mechanical), [id number]- Ionotophoresis 4mg /ml Dexamethasone , Taping, Dry Needling, Joint manipulation, and Spinal manipulation.   Loukas Antonson PT, DPT 03/22/2024, 9:40 AM

## 2024-03-27 ENCOUNTER — Encounter: Payer: Self-pay | Admitting: Physical Therapy

## 2024-03-27 ENCOUNTER — Ambulatory Visit: Attending: Student | Admitting: Physical Therapy

## 2024-03-27 DIAGNOSIS — M6281 Muscle weakness (generalized): Secondary | ICD-10-CM | POA: Diagnosis present

## 2024-03-27 DIAGNOSIS — M542 Cervicalgia: Secondary | ICD-10-CM | POA: Diagnosis present

## 2024-03-27 NOTE — Therapy (Signed)
 OUTPATIENT PHYSICAL THERAPY DAILY NOTE   Patient Name: Kristi Daniels MRN: [id number] DOB:May 22, 1976, 48 y.o., female Today's Date: 03/27/2024   PT End of Session - 03/27/24 0845     Visit Number 3    Date for PT Re-Evaluation 05/03/24    Authorization Type [employer] - NDI    Authorization Time Period 03/08/24-05/07/24; 10 visits    Authorization - Visit Number 3    Authorization - Number of Visits 10    PT Start Time 0845    PT Stop Time 0925    PT Time Calculation (min) 40 min             Past Medical History:  Diagnosis Date   Arthritis    "in back"   Diabetes mellitus without complication (HCC)    type 2   Hypertension    Past Surgical History:  Procedure Laterality Date   JOINT REPLACEMENT     elbow    TUBAL LIGATION Bilateral 2004   Patient Active Problem List   Diagnosis Date Noted   Spinal stenosis at L4-L5 level 05/31/2021    PCP: Janifer Meigs, FNP  REFERRING PROVIDER: Jeannette Mills*  THERAPY DIAG:  Cervicalgia  Muscle weakness  REFERRING DIAG: Radiculopathy, cervical region [M54.12]   Rationale for Evaluation and Treatment:  Rehabilitation  SUBJECTIVE:  PERTINENT PAST HISTORY:  Hx of lumbar fusion, DM II,       PRECAUTIONS: Other: lumbar fusion  WEIGHT BEARING RESTRICTIONS No  FALLS:  Has patient fallen in last 6 months? No, Number of falls: 0  MOI/History of condition:  Onset date: ~ 6 months  SUBJECTIVE STATEMENT  03/27/2024: Pt reports that her pain is 6.5/10 and she is stiff but okay this morning.     EVAL: Kristi Daniels is a 48 y.o. female who presents to clinic with chief complaint of neck pain and UE radicular sxs.  She also has LE radiculopathy with hx of lumbar fusion.  She was having quite a lot of shoulder blade pain starting about 6 months ago with no inciting event.  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  She is required to do PT before she gets an MRI.  She is having n/t  in both UE R>L.  The n/t is variable but occurs most often in her R hand digits 1-3.  Denies gait disturbance.   Red flags:  denies dizziness, diplopia, drop attack, dysarthria, dysphagia, nystagmus, nausea, and facial numbness  Pain:  Are you having pain? Yes Pain location: "entire spine" NPRS scale:  6.5/10  Aggravating factors: neck movements, "too much activity" Relieving factors: rest Pain description: constant, sharp, and aching Stage: Chronic 24 hour pattern: worse with activity   Occupation: na  Assistive Device: na  Hand Dominance: R  Patient Goals/Specific Activities: reduce pain   OBJECTIVE:   DIAGNOSTIC FINDINGS:  None recent in epic  GENERAL OBSERVATION: Forward head, rounded shoulders     SENSATION: Light touch: Deficits digits 1-3 R   PALPATION: Significant TTP bil sub occipitals, bil UT, bil LS, cervical paraspinals  Cervical ROM  ROM ROM  (Eval) 03/27/24  Flexion 20*   Extension 20*   Right lateral flexion 40*   Left lateral flexion 40*   Right rotation 40* 60  Left rotation 40* 60  Flexion rotation (normal is 30 degrees)    Flexion rotation (normal is 30 degrees)      (Blank rows = not tested, N = WNL, * =  concordant pain)  UPPER EXTREMITY MMT:  MMT Right (Eval) Left (Eval)  Shoulder flexion    Shoulder abduction (C5) c c  Shoulder ER    Shoulder IR    Middle trapezius    Lower trapezius    Shoulder extension    Grip strength 50 psi 50 psi  Shoulder shrug (C4)    Elbow flexion (C6) c c  Elbow ext (C7) D c  Thumb ext (C8) c c  Finger abd (T1) c c  Grossly     (Blank rows = not tested, score listed is out of 5 possible points.  N = WNL, D = diminished, C = clear for gross weakness with myotome testing, * = concordant pain with testing)   SPECIAL TESTS:  Spurling's (+) for local pain not radicular sxs  JOINT MOBILITY TESTING:  Hypomobile throughout Cx spine  PATIENT SURVEYS:  NDI: 32/50 pts    TODAY'S TREATMENT:   [facility] Adult PT Treatment:                                                DATE: 03/27/24 Therapeutic Exercise: Seated horiz abdct yellow x 10 Seated star pattern 5 x 2 yellow  Seated chin tuck x 5  Pec stretch in doorway   Self Care: Tennis ball for self TPR to periscap against wall  Sub occipital tennis ball release  Pt given tennis balls today to perform above interventions at home      [facility] Adult PT Treatment  03/22/2024:  Therapeutic Exercise: UT stretch LS stretch - stopped d/t pain UBE - 3'/3' following TDN Shoulder rolls Corner pec stretch - not tolerated d/t pain   [place]/HM: Instructions on theracane for self TPR and pin and stretch  Manual Therapy  Manual TPR bil UT performed by Hartley Line, DTP Skilled palpation to identify and monitor TPs during TDN  Trigger Point Dry Needling  Initial Treatment: Pt instructed on Dry Needling rational, procedures, and possible side effects. Pt instructed to expect mild to moderate muscle soreness later in the day and/or into the next day.  Pt instructed in methods to reduce muscle soreness. Pt instructed to continue prescribed HEP. Because Dry Needling was performed over or adjacent to a lung field, pt was educated on S/S of pneumothorax and to seek immediate medical attention should they occur.  Patient was educated on signs and symptoms of infection and other risk factors and advised to seek medical attention should they occur.  Patient verbalized understanding of these instructions and education.   Patient Verbal Consent Given: Yes Education Handout Provided: Yes Muscles Treated: bil UT Electrical Stimulation Performed: No Treatment Response/Outcome: twitch       HOME EXERCISE PROGRAM: Access Code: [email or web address] URL: [email or web address] Date: 03/08/2024 Prepared by: Lesleigh Rash  Exercises - Seated Cervical Retraction  - 3 x daily - 7 x weekly - 1 sets - 10 reps - 5'' hold - Supine Cervical Retraction  with Towel  - 2 x daily - 7 x weekly - 2 sets - 10 reps - 5 second hold - Seated Assisted Cervical Rotation with Towel  - 2-3 x daily - 7 x weekly - 2 sets - 15 reps 03/27/24 - Seated Shoulder Horizontal Abduction with Resistance  - 1 x daily - 7 x weekly - 1-2 sets - 10 reps - Alternating star pattern  - 1  x daily - 7 x weekly - 1-2 sets - 10 reps  Treatment priorities   Eval        Mobility of cx spine        Sub occipital manual/TDN        Periscapular/cervical strengthening        DNF endurance                  ASSESSMENT:  CLINICAL IMPRESSION:  03/27/2024:  Aleksis tolerated session well with no adverse reaction.  Pt reports short term relief of pain for a few minutes after TPDN and she felt soreness. Her cervical AROM for rotation has improved bilaterally.  Pt issued tennis balls today for self TPR along periscap and sub occipital release. She does have increased low back pain with supine lying.  Began light UE/scap strengthening today in sitting with pt reporting good tolerance. She has a theracord at home that she currently uses for UE strength (horiz abdct and bicep curls). Updated HEP. At end of session she reported no increase in her pain.     EVAL: Jonna is a 48 y.o. female who presents to clinic with signs and sxs consistent with chronic neck pain with mobility deficits.  She is hypomobile throughout cx spine and has concurrent muscle tightness and pain.  No overt signs of radiculopathy on exam with the exception of some n/t in digits 1-3 on the R hand.  She will benefit from skilled PT to address relevant deficits and improve comfort in daily activities.    OBJECTIVE IMPAIRMENTS: Pain, cervical ROM, UE strength  ACTIVITY LIMITATIONS: sitting, reading, driving, looking up/down/rotation  PERSONAL FACTORS: See medical history and pertinent history   REHAB POTENTIAL: Good  CLINICAL DECISION MAKING: Evolving/moderate complexity  EVALUATION COMPLEXITY:  Moderate   GOALS:   SHORT TERM GOALS: Target date: 04/05/2024   Xiadani will be >75% HEP compliant to improve carryover between sessions and facilitate independent management of condition  Evaluation: ongoing Goal status: INITIAL   LONG TERM GOALS: Target date: 05/03/2024   Kashish will self report >/= 50% decrease in pain from evaluation to improve function and in daily tasks requiring head movements  Evaluation/Baseline: 9/10 max pain Goal status: INITIAL   2.  Taran will show a >/= 10 pt improvement in their NDI score (MCID ~20% or 10/50 pts)   Evaluation/Baseline: 32 pts Goal status: INITIAL   3.  Akaila will improve cervical rotation to >/= 60 degrees to allow improved tolerance to head turns in driving and other dialy activities  Evaluation/Baseline: 40 degrees with pain Goal status: INITIAL   4.  Magin will report confidence in self management of condition at time of discharge with advanced HEP  Evaluation/Baseline: unable to self manage Goal status: INITIAL   PLAN: PT FREQUENCY: 1-2x/week  PT DURATION: 8 weeks  PLANNED INTERVENTIONS:  97164- PT Re-evaluation, 97110-Therapeutic exercises, 97530- Therapeutic activity, [id number]- Neuromuscular re-education, 97535- Self Care, 54098- Manual therapy, [id number]- Gait training, [id number]- Aquatic Therapy, [id number]- Electrical stimulation (manual), [id number]- Vasopneumatic device, [id number]- Traction (mechanical), [id number]- Ionotophoresis 4mg /ml Dexamethasone , Taping, Dry Needling, Joint manipulation, and Spinal manipulation.   Gasper Karst, PTA 03/27/24 9:29 AM Phone: [phone] Fax: [phone]

## 2024-04-03 ENCOUNTER — Ambulatory Visit: Admitting: Physical Therapy

## 2024-04-03 ENCOUNTER — Encounter: Payer: Self-pay | Admitting: Physical Therapy

## 2024-04-03 DIAGNOSIS — M542 Cervicalgia: Secondary | ICD-10-CM | POA: Diagnosis not present

## 2024-04-03 DIAGNOSIS — M6281 Muscle weakness (generalized): Secondary | ICD-10-CM

## 2024-04-03 NOTE — Therapy (Signed)
 OUTPATIENT PHYSICAL THERAPY DAILY NOTE   Patient Name: Kristi Daniels MRN: [id number] DOB:25-Aug-1976, 48 y.o., female Today's Date: 04/03/2024   PT End of Session - 04/03/24 0844     Visit Number 4    Number of Visits --   1-2 x per week   Date for PT Re-Evaluation 05/03/24    Authorization Type [employer] - NDI    Authorization Time Period 03/08/24-05/07/24; 10 visits    Authorization - Number of Visits 10    PT Start Time 0845    PT Stop Time 0927    PT Time Calculation (min) 42 min             Past Medical History:  Diagnosis Date   Arthritis    in back   Diabetes mellitus without complication (HCC)    type 2   Hypertension    Past Surgical History:  Procedure Laterality Date   JOINT REPLACEMENT     elbow    TUBAL LIGATION Bilateral 2004   Patient Active Problem List   Diagnosis Date Noted   Spinal stenosis at L4-L5 level 05/31/2021    PCP: Janifer Meigs, FNP  REFERRING PROVIDER: Janifer Meigs, *  THERAPY DIAG:  Cervicalgia  Muscle weakness  REFERRING DIAG: Radiculopathy, cervical region [M54.12]   Rationale for Evaluation and Treatment:  Rehabilitation  SUBJECTIVE:  PERTINENT PAST HISTORY:  Hx of lumbar fusion, DM II,       PRECAUTIONS: Other: lumbar fusion  WEIGHT BEARING RESTRICTIONS No  FALLS:  Has patient fallen in last 6 months? No, Number of falls: 0  MOI/History of condition:  Onset date: ~ 6 months  SUBJECTIVE STATEMENT  04/03/2024: Pt reports that she has been using a tennis ball for self TPR with some benefit.  She rates her current pain 7/10.    EVAL: Kristi Daniels is a 48 y.o. female who presents to clinic with chief complaint of neck pain and UE radicular sxs.  She also has LE radiculopathy with hx of lumbar fusion.  She was having quite a lot of shoulder blade pain starting about 6 months ago with no inciting event.  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  She is required to do  PT before she gets an MRI.  She is having n/t in both UE R>L.  The n/t is variable but occurs most often in her R hand digits 1-3.  Denies gait disturbance.   Red flags:  denies dizziness, diplopia, drop attack, dysarthria, dysphagia, nystagmus, nausea, and facial numbness  Pain:  Are you having pain? Yes Pain location: entire spine NPRS scale:  6.5/10  Aggravating factors: neck movements, too much activity Relieving factors: rest Pain description: constant, sharp, and aching Stage: Chronic 24 hour pattern: worse with activity   Occupation: na  Assistive Device: na  Hand Dominance: R  Patient Goals/Specific Activities: reduce pain   OBJECTIVE:   DIAGNOSTIC FINDINGS:  None recent in epic  GENERAL OBSERVATION: Forward head, rounded shoulders     SENSATION: Light touch: Deficits digits 1-3 R   PALPATION: Significant TTP bil sub occipitals, bil UT, bil LS, cervical paraspinals  Cervical ROM  ROM ROM  (Eval) 03/27/24  Flexion 20*   Extension 20*   Right lateral flexion 40*   Left lateral flexion 40*   Right rotation 40* 60  Left rotation 40* 60  Flexion rotation (normal is 30 degrees)    Flexion rotation (normal is 30 degrees)      (  Blank rows = not tested, N = WNL, * = concordant pain)  UPPER EXTREMITY MMT:  MMT Right (Eval) Left (Eval)  Shoulder flexion    Shoulder abduction (C5) c c  Shoulder ER    Shoulder IR    Middle trapezius    Lower trapezius    Shoulder extension    Grip strength 50 psi 50 psi  Shoulder shrug (C4)    Elbow flexion (C6) c c  Elbow ext (C7) D c  Thumb ext (C8) c c  Finger abd (T1) c c  Grossly     (Blank rows = not tested, score listed is out of 5 possible points.  N = WNL, D = diminished, C = clear for gross weakness with myotome testing, * = concordant pain with testing)   SPECIAL TESTS:  Spurling's (+) for local pain not radicular sxs  JOINT MOBILITY TESTING:  Hypomobile throughout Cx spine  PATIENT SURVEYS:   NDI: 32/50 pts    TODAY'S TREATMENT:   [facility] Adult PT Treatment:                                                DATE: 04/03/24 Therapeutic Exercise: Supine horiz abdct RTB 2x10 Supine star pattern 5 x 2 RTB  Pec stretch in doorway  Seated shoulder rolls Chin tuck with RTB resistance - 2x5 - 3'' hold Seated thoracic ext over 1/2 foam roller  Manual Therapy Manual cervical traction Sub occipital release STM cervical paraspinals  STM bil UT  [facility] Adult PT Treatment:                                                DATE: 03/27/24 Therapeutic Exercise: Seated horiz abdct yellow x 10 Seated star pattern 5 x 2 yellow  Seated chin tuck x 5  Pec stretch in doorway   Self Care: Tennis ball for self TPR to periscap against wall  Sub occipital tennis ball release  Pt given tennis balls today to perform above interventions at home      [facility] Adult PT Treatment  03/22/2024:  Therapeutic Exercise: UT stretch LS stretch - stopped d/t pain UBE - 3'/3' following TDN Shoulder rolls Corner pec stretch - not tolerated d/t pain   [place]/HM: Instructions on theracane for self TPR and pin and stretch  Manual Therapy  Manual TPR bil UT performed by Hartley Line, DTP Skilled palpation to identify and monitor TPs during TDN  Trigger Point Dry Needling  Initial Treatment: Pt instructed on Dry Needling rational, procedures, and possible side effects. Pt instructed to expect mild to moderate muscle soreness later in the day and/or into the next day.  Pt instructed in methods to reduce muscle soreness. Pt instructed to continue prescribed HEP. Because Dry Needling was performed over or adjacent to a lung field, pt was educated on S/S of pneumothorax and to seek immediate medical attention should they occur.  Patient was educated on signs and symptoms of infection and other risk factors and advised to seek medical attention should they occur.  Patient verbalized understanding of these instructions  and education.   Patient Verbal Consent Given: Yes Education Handout Provided: Yes Muscles Treated: bil UT Electrical Stimulation Performed: No Treatment Response/Outcome: twitch  HOME EXERCISE PROGRAM: Access Code: [email or web address] URL: [email or web address] Date: 03/08/2024 Prepared by: Lesleigh Rash  Exercises - Seated Cervical Retraction  - 3 x daily - 7 x weekly - 1 sets - 10 reps - 5'' hold - Supine Cervical Retraction with Towel  - 2 x daily - 7 x weekly - 2 sets - 10 reps - 5 second hold - Seated Assisted Cervical Rotation with Towel  - 2-3 x daily - 7 x weekly - 2 sets - 15 reps 03/27/24 - Seated Shoulder Horizontal Abduction with Resistance  - 1 x daily - 7 x weekly - 1-2 sets - 10 reps - Alternating star pattern  - 1 x daily - 7 x weekly - 1-2 sets - 10 reps  Treatment priorities   Eval        Mobility of cx spine        Sub occipital manual/TDN        Periscapular/cervical strengthening        DNF endurance                  ASSESSMENT:  CLINICAL IMPRESSION:  04/03/2024:  Mahi tolerated session well with no adverse reaction.  Pt reports no significant improvement in pain or sxs with therex or manual therapy.  Added resisted chin tuck for DNF strength.  Thoracic ext increased low back pain.  Will continue trying to progress strength and mobility as tolerated.    EVAL: Liliann is a 48 y.o. female who presents to clinic with signs and sxs consistent with chronic neck pain with mobility deficits.  She is hypomobile throughout cx spine and has concurrent muscle tightness and pain.  No overt signs of radiculopathy on exam with the exception of some n/t in digits 1-3 on the R hand.  She will benefit from skilled PT to address relevant deficits and improve comfort in daily activities.    OBJECTIVE IMPAIRMENTS: Pain, cervical ROM, UE strength  ACTIVITY LIMITATIONS: sitting, reading, driving, looking up/down/rotation  PERSONAL FACTORS: See medical  history and pertinent history   REHAB POTENTIAL: Good  CLINICAL DECISION MAKING: Evolving/moderate complexity  EVALUATION COMPLEXITY: Moderate   GOALS:   SHORT TERM GOALS: Target date: 04/05/2024   Carina will be >75% HEP compliant to improve carryover between sessions and facilitate independent management of condition  Evaluation: ongoing Goal status: INITIAL   LONG TERM GOALS: Target date: 05/03/2024   Jillienne will self report >/= 50% decrease in pain from evaluation to improve function and in daily tasks requiring head movements  Evaluation/Baseline: 9/10 max pain Goal status: INITIAL   2.  Mozelle will show a >/= 10 pt improvement in their NDI score (MCID ~20% or 10/50 pts)   Evaluation/Baseline: 32 pts Goal status: INITIAL   3.  Brenlee will improve cervical rotation to >/= 60 degrees to allow improved tolerance to head turns in driving and other dialy activities  Evaluation/Baseline: 40 degrees with pain Goal status: INITIAL   4.  [place] will report confidence in self management of condition at time of discharge with advanced HEP  Evaluation/Baseline: unable to self manage Goal status: INITIAL   PLAN: PT FREQUENCY: 1-2x/week  PT DURATION: 8 weeks  PLANNED INTERVENTIONS:  97164- PT Re-evaluation, 97110-Therapeutic exercises, 97530- Therapeutic activity, [id number]- Neuromuscular re-education, 97535- Self Care, 01027- Manual therapy, [id number]- Gait training, [id number]- Aquatic Therapy, [id number]- Electrical stimulation (manual), [id number]- Vasopneumatic device, [id number]- Traction (mechanical), [id number]- Ionotophoresis 4mg /ml Dexamethasone , Taping, Dry Needling, Joint manipulation, and Spinal manipulation.   Jovi Alvizo E Flynn Gwyn PT  04/03/24 9:30 AM Phone: [phone] Fax: [phone]

## 2024-04-10 ENCOUNTER — Encounter: Payer: Self-pay | Admitting: Physical Therapy

## 2024-04-10 ENCOUNTER — Ambulatory Visit: Admitting: Physical Therapy

## 2024-04-10 DIAGNOSIS — M542 Cervicalgia: Secondary | ICD-10-CM

## 2024-04-10 DIAGNOSIS — M6281 Muscle weakness (generalized): Secondary | ICD-10-CM

## 2024-04-10 NOTE — Therapy (Addendum)
 PHYSICAL THERAPY DISCHARGE SUMMARY  Visits from Start of Care: 5  Current functional level related to goals / functional outcomes: See assessment/goals   Remaining deficits: See assessment/goals   Education / Equipment: HEP and D/C plans  Patient agrees to discharge. Patient goals were partially met. Patient is being discharged due to lack of progress.   Patient Name: Kristi Daniels MRN: [id number] DOB:02/04/1976, 48 y.o., female Today's Date: 04/10/2024   PT End of Session - 04/10/24 0845     Visit Number 5    Number of Visits --   1-2 x per week   Date for PT Re-Evaluation 05/03/24    Authorization Type [employer] MCD - NDI    Authorization Time Period 03/08/24-05/07/24; 10 visits    Authorization - Visit Number 4    Authorization - Number of Visits 10    PT Start Time 0845    PT Stop Time 0923    PT Time Calculation (min) 38 min          Past Medical History:  Diagnosis Date   Arthritis    in back   Diabetes mellitus without complication (HCC)    type 2   Hypertension    Past Surgical History:  Procedure Laterality Date   JOINT REPLACEMENT     elbow    TUBAL LIGATION Bilateral 2004   Patient Active Problem List   Diagnosis Date Noted   Spinal stenosis at L4-L5 level 05/31/2021    PCP: Elizbeth Leita Ruth, FNP  REFERRING PROVIDER: Elizbeth Leita Ruth, *  THERAPY DIAG:  Cervicalgia  Muscle weakness  REFERRING DIAG: Radiculopathy, cervical region [M54.12]   Rationale for Evaluation and Treatment:  Rehabilitation  SUBJECTIVE:  PERTINENT PAST HISTORY:  Hx of lumbar fusion, DM II,       PRECAUTIONS: Other: lumbar fusion  WEIGHT BEARING RESTRICTIONS No  FALLS:  Has patient fallen in last 6 months? No, Number of falls: 0  MOI/History of condition:  Onset date: ~ 6 months  SUBJECTIVE STATEMENT  04/10/2024: Pt reports flexibility is a little better, no change in pain. 7.5/10. Increased pain in bilat arm with activity. Numbness in  hands is constant, 1-3 digits and front/back of palms. Scapular pain decreased.   EVAL: Kristi Daniels is a 48 y.o. female who presents to clinic with chief complaint of neck pain and UE radicular sxs.  She also has LE radiculopathy with hx of lumbar fusion.  She was having quite a lot of shoulder blade pain starting about 6 months ago with no inciting event.  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  She is required to do PT before she gets an MRI.  She is having n/t in both UE R>L.  The n/t is variable but occurs most often in her R hand digits 1-3.  Denies gait disturbance.   Red flags:  denies dizziness, diplopia, drop attack, dysarthria, dysphagia, nystagmus, nausea, and facial numbness  Pain:  Are you having pain? Yes Pain location: entire spine NPRS scale:  7.5/10 Aggravating factors: neck movements, too much activity Relieving factors: rest Pain description: constant, sharp, and aching Stage: Chronic 24 hour pattern: worse with activity   Occupation: na  Assistive Device: na  Hand Dominance: R  Patient Goals/Specific Activities: reduce pain   OBJECTIVE:   DIAGNOSTIC FINDINGS:  None recent in epic  GENERAL OBSERVATION: Forward head, rounded shoulders     SENSATION: Light touch: Deficits digits 1-3 R   PALPATION: Significant TTP bil sub  occipitals, bil UT, bil LS, cervical paraspinals  Cervical ROM  ROM ROM  (Eval) 03/27/24 04/10/24  Flexion 20*    Extension 20*    Right lateral flexion 40*    Left lateral flexion 40*    Right rotation 40* 60 66  Left rotation 40* 60 70  Flexion rotation (normal is 30 degrees)     Flexion rotation (normal is 30 degrees)       (Blank rows = not tested, N = WNL, * = concordant pain)  UPPER EXTREMITY MMT:  MMT Right (Eval) Left (Eval)  Shoulder flexion    Shoulder abduction (C5) c c  Shoulder ER    Shoulder IR    Middle trapezius    Lower trapezius    Shoulder extension    Grip strength 50  psi 50 psi  Shoulder shrug (C4)    Elbow flexion (C6) c c  Elbow ext (C7) D c  Thumb ext (C8) c c  Finger abd (T1) c c  Grossly     (Blank rows = not tested, score listed is out of 5 possible points.  N = WNL, D = diminished, C = clear for gross weakness with myotome testing, * = concordant pain with testing)   SPECIAL TESTS:  Spurling's (+) for local pain not radicular sxs  JOINT MOBILITY TESTING:  Hypomobile throughout Cx spine  PATIENT SURVEYS:  NDI: 32/50 pts 04/10/24: NDI: 27/50    TODAY'S TREATMENT:  [facility] Adult PT Treatment:                                                DATE: 04/10/24 Therapeutic Exercise: Review of HEP  Therapeutic Activity: NDI Goal check POC  Self Care: Pain management strategies discussed/ reviewed: HEP stretches, Tennis ball for self TPR release UT, sub occipitals            HOME EXERCISE PROGRAM: Access Code: [email or web address] URL: [email or web address] Date: 03/08/2024 Prepared by: Helene Gasmen  Exercises - Seated Cervical Retraction  - 3 x daily - 7 x weekly - 1 sets - 10 reps - 5'' hold - Supine Cervical Retraction with Towel  - 2 x daily - 7 x weekly - 2 sets - 10 reps - 5 second hold - Seated Assisted Cervical Rotation with Towel  - 2-3 x daily - 7 x weekly - 2 sets - 15 reps 03/27/24 - Seated Shoulder Horizontal Abduction with Resistance  - 1 x daily - 7 x weekly - 1-2 sets - 10 reps - Alternating star pattern  - 1 x daily - 7 x weekly - 1-2 sets - 10 reps  Treatment priorities   Eval        Mobility of cx spine        Sub occipital manual/TDN        Periscapular/cervical strengthening        DNF endurance                  ASSESSMENT:  CLINICAL IMPRESSION:  04/10/2024:   Pt reports no significant improvement in pain or sxs with therex or manual therapy. Is independent with self management of condition using tennis balls, activity pacing, and HEP, LTG#  4 met. Her cervical rotation ROM improved, meeting LTG  #3. She continues to have bilateral UE radicular sx and constant 1-3 digit N/T bilateral. Her symptoms  increase with activity. She does endorse decreased scapular pain. Overall her cervical pain levels remain, high between 7-9/10. At this time she has completed 4 weeks of PT and has reached a plateau in her progress. She will be referred back to MD for next treatment options.     EVAL: Kristi Daniels is a 48 y.o. female who presents to clinic with signs and sxs consistent with chronic neck pain with mobility deficits.  She is hypomobile throughout cx spine and has concurrent muscle tightness and pain.  No overt signs of radiculopathy on exam with the exception of some n/t in digits 1-3 on the R hand.  She will benefit from skilled PT to address relevant deficits and improve comfort in daily activities.    OBJECTIVE IMPAIRMENTS: Pain, cervical ROM, UE strength  ACTIVITY LIMITATIONS: sitting, reading, driving, looking up/down/rotation  PERSONAL FACTORS: See medical history and pertinent history   REHAB POTENTIAL: Good  CLINICAL DECISION MAKING: Evolving/moderate complexity  EVALUATION COMPLEXITY: Moderate   GOALS:   SHORT TERM GOALS: Target date: 04/05/2024   Kristi Daniels will be >75% HEP compliant to improve carryover between sessions and facilitate independent management of condition  Evaluation: ongoing Goal status: MET   LONG TERM GOALS: Target date: 05/03/2024   Kristi Daniels will self report >/= 50% decrease in pain from evaluation to improve function and in daily tasks requiring head movements  Evaluation/Baseline: 9/10 max pain 04/10/24: still reaches 9/10 regularly, average 7/10 Goal status: NOT MET   2.  Kristi Daniels will show a >/= 10 pt improvement in their NDI score (MCID ~20% or 10/50 pts)   Evaluation/Baseline: 32 pts 04/10/24: 27 pts Goal status: NOT MET    3.  Kristi Daniels will improve cervical rotation to >/= 60 degrees to allow improved tolerance to head turns in driving and other  dialy activities  Evaluation/Baseline: 40 degrees with pain Goal status: MET   4.  Kristi Daniels will report confidence in self management of condition at time of discharge with advanced HEP  Evaluation/Baseline: unable to self manage 04/10/24: Tennis ball, HEP, pacing  Goal status: MET   PLAN: PT FREQUENCY: 1-2x/week  PT DURATION: 8 weeks  PLANNED INTERVENTIONS:  97164- PT Re-evaluation, 97110-Therapeutic exercises, 97530- Therapeutic activity, 97112- Neuromuscular re-education, 97535- Self Care, 02859- Manual therapy, [id number]- Gait training, [id number]- Aquatic Therapy, [phone]- Electrical stimulation (manual), [id number]- Vasopneumatic device, [id number]- Traction (mechanical), [id number]- Ionotophoresis 4mg /ml Dexamethasone , Taping, Dry Needling, Joint manipulation, and Spinal manipulation.   Harlene CHRISTELLA Persons PTA 04/10/24 11:00 AM  Helene Gasmen PT, DPT 04/11/24 1:37 PM  Phone: [phone] Fax: [phone]

## 2024-04-18 DIAGNOSIS — M5412 Radiculopathy, cervical region: Secondary | ICD-10-CM

## 2024-04-18 HISTORY — DX: Radiculopathy, cervical region: M54.12

## 2024-09-03 ENCOUNTER — Other Ambulatory Visit: Payer: Self-pay | Admitting: Neurosurgery

## 2024-09-10 NOTE — Progress Notes (Signed)
 Surgical Instructions   Your procedure is scheduled on Monday, November 24th, 2025. Report to [facility] Main Entrance A at 10:00 A.M., then check in with the Admitting office. Any questions or running late day of surgery: call [phone]  Questions prior to your surgery date: call [phone], Monday-Friday, 8am-4pm. If you experience any cold or flu symptoms such as cough, fever, chills, shortness of breath, etc. between now and your scheduled surgery, please notify us  at the above number.     Remember:  Do not eat or drink after midnight the night before your surgery    Take these medicines the morning of surgery with A SIP OF WATER: Amlodipine  (Norvasc ) Clonazepam (Klonopin) Simvastatin  (Zocor ) Venlafaxine  XR (Effexor -XR)   May take these medicines IF NEEDED: Cyclobenzaprine  (Flexeril )    One week prior to surgery, STOP taking any Aspirin (unless otherwise instructed by your surgeon) Aleve, Naproxen, Ibuprofen, Motrin, Advil, Goody's, BC's, all herbal medications, fish oil, and non-prescription vitamins.  This includes Meloxicam  (Mobic ).     WHAT DO I DO ABOUT MY DIABETES MEDICATION?   Dulaglutide  should be held for at least 7 days prior to surgery.  Your last dose of Dulaglutide  should be on or before Sunday, November 16th.  Empagliflozin  (Jardiance ) should be held for 72 hours prior to surgery.  Your last dose should be on Thursday, November 20th.       HOW TO MANAGE YOUR DIABETES BEFORE AND AFTER SURGERY  Why is it important to control my blood sugar before and after surgery? Improving blood sugar levels before and after surgery helps healing and can limit problems. A way of improving blood sugar control is eating a healthy diet by:  Eating less sugar and carbohydrates  Increasing activity/exercise  Talking with your doctor about reaching your blood sugar goals High blood sugars (greater than 180 mg/dL) can raise your risk of infections and slow your  recovery, so you will need to focus on controlling your diabetes during the weeks before surgery. Make sure that the doctor who takes care of your diabetes knows about your planned surgery including the date and location.  How do I manage my blood sugar before surgery? Check your blood sugar at least 4 times a day, starting 2 days before surgery, to make sure that the level is not too high or low.  Check your blood sugar the morning of your surgery when you wake up and every 2 hours until you get to the Short Stay unit.  If your blood sugar is less than 70 mg/dL, you will need to treat for low blood sugar: Do not take insulin . Treat a low blood sugar (less than 70 mg/dL) with  cup of clear juice (cranberry or apple), 4 glucose tablets, OR glucose gel. Recheck blood sugar in 15 minutes after treatment (to make sure it is greater than 70 mg/dL). If your blood sugar is not greater than 70 mg/dL on recheck, call [phone] for further instructions. Report your blood sugar to the short stay nurse when you get to Short Stay.  If you are admitted to the hospital after surgery: Your blood sugar will be checked by the staff and you will probably be given insulin  after surgery (instead of oral diabetes medicines) to make sure you have good blood sugar levels. The goal for blood sugar control after surgery is 80-180 mg/dL.                      Do NOT Smoke (Tobacco/Vaping)  for 24 hours prior to your procedure.  If you use a CPAP at night, you may bring your mask/headgear for your overnight stay.   You will be asked to remove any contacts, glasses, piercing's, hearing aid's, dentures/partials prior to surgery. Please bring cases for these items if needed.    Patients discharged the day of surgery will not be allowed to drive home, and someone needs to stay with them for 24 hours.  SURGICAL WAITING ROOM VISITATION Patients may have no more than 2 support people in the waiting area - these visitors  may rotate.   Pre-op nurse will coordinate an appropriate time for 1 ADULT support person, who may not rotate, to accompany patient in pre-op.  Children under the age of 42 must have an adult with them who is not the patient and must remain in the main waiting area with an adult.  If the patient needs to stay at the hospital during part of their recovery, the visitor guidelines for inpatient rooms apply.  Please refer to the [facility] website for the visitor guidelines for any additional information.   If you received a COVID test during your pre-op visit  it is requested that you wear a mask when out in public, stay away from anyone that may not be feeling well and notify your surgeon if you develop symptoms. If you have been in contact with anyone that has tested positive in the last 10 days please notify you surgeon.      Pre-operative 4 CHG Bathing Instructions   You can play a key role in reducing the risk of infection after surgery. Your skin needs to be as free of germs as possible. You can reduce the number of germs on your skin by washing with CHG (chlorhexidine  gluconate) soap before surgery. CHG is an antiseptic soap that kills germs and continues to kill germs even after washing.   DO NOT use if you have an allergy to chlorhexidine /CHG or antibacterial soaps. If your skin becomes reddened or irritated, stop using the CHG and notify one of our RNs at [phone].   Please shower with the CHG soap starting 4 days before surgery using the following schedule:     Please keep in mind the following:  DO NOT shave, including legs and underarms, starting the day of your first shower.   You may shave your face at any point before/day of surgery.  Place clean sheets on your bed the day you start using CHG soap. Use a clean washcloth (not used since being washed) for each shower. DO NOT sleep with pets once you start using the CHG.   CHG Shower Instructions:  Wash your face and  private area with normal soap. If you choose to wash your hair, wash first with your normal shampoo.  After you use shampoo/soap, rinse your hair and body thoroughly to remove shampoo/soap residue.  Turn the water OFF and apply  bottle of CHG soap to a CLEAN washcloth.  Apply CHG soap ONLY FROM YOUR NECK DOWN TO YOUR TOES (washing for 3-5 minutes)  DO NOT use CHG soap on face, private areas, open wounds, or sores.  Pay special attention to the area where your surgery is being performed.  If you are having back surgery, having someone wash your back for you may be helpful. Wait 2 minutes after CHG soap is applied, then you may rinse off the CHG soap.  Pat dry with a clean towel  Put on clean clothes/pajamas  If you choose to wear lotion, please use ONLY the CHG-compatible lotions that are listed below.  Additional instructions for the day of surgery:  If you choose, you may shower the morning of surgery with an antibacterial soap.  DO NOT APPLY any lotions, deodorants, cologne, or perfumes.   Do not bring valuables to the hospital. [facility] is not responsible for any belongings/valuables. Do not wear nail polish, gel polish, artificial nails, or any other type of covering on natural nails (fingers and toes) Do not wear jewelry or makeup Put on clean/comfortable clothes.  Please brush your teeth.  Ask your nurse before applying any prescription medications to the skin.     CHG Compatible Lotions   Aveeno Moisturizing lotion  Cetaphil Moisturizing Cream  Cetaphil Moisturizing Lotion  Clairol Herbal Essence Moisturizing Lotion, Dry Skin  Clairol Herbal Essence Moisturizing Lotion, Extra Dry Skin  Clairol Herbal Essence Moisturizing Lotion, Normal Skin  Curel Age Defying Therapeutic Moisturizing Lotion with Alpha Hydroxy  Curel Extreme Care Body Lotion  Curel Soothing Hands Moisturizing Hand Lotion  Curel Therapeutic Moisturizing Cream, Fragrance-Free  Curel Therapeutic  Moisturizing Lotion, Fragrance-Free  Curel Therapeutic Moisturizing Lotion, Original Formula  Eucerin Daily Replenishing Lotion  Eucerin Dry Skin Therapy Plus Alpha Hydroxy Crme  Eucerin Dry Skin Therapy Plus Alpha Hydroxy Lotion  Eucerin Original Crme  Eucerin Original Lotion  Eucerin Plus Crme Eucerin Plus Lotion  Eucerin TriLipid Replenishing Lotion  Keri Anti-Bacterial Hand Lotion  Keri Deep Conditioning Original Lotion Dry Skin Formula Softly Scented  Keri Deep Conditioning Original Lotion, Fragrance Free Sensitive Skin Formula  Keri Lotion Fast Absorbing Fragrance Free Sensitive Skin Formula  Keri Lotion Fast Absorbing Softly Scented Dry Skin Formula  Keri Original Lotion  Keri Skin Renewal Lotion Keri Silky Smooth Lotion  Keri Silky Smooth Sensitive Skin Lotion  Nivea Body Creamy Conditioning Oil  Nivea Body Extra Enriched Lotion  Nivea Body Original Lotion  Nivea Body Sheer Moisturizing Lotion Nivea Crme  Nivea Skin Firming Lotion  NutraDerm 30 Skin Lotion  NutraDerm Skin Lotion  NutraDerm Therapeutic Skin Cream  NutraDerm Therapeutic Skin Lotion  ProShield Protective Hand Cream  Provon moisturizing lotion  Please read over the following fact sheets that you were given.

## 2024-09-11 ENCOUNTER — Other Ambulatory Visit: Payer: Self-pay

## 2024-09-11 ENCOUNTER — Encounter (HOSPITAL_COMMUNITY)
Admission: RE | Admit: 2024-09-11 | Discharge: 2024-09-11 | Disposition: A | Discharge status: Home / Self Care | Source: Ambulatory Visit | Attending: Neurosurgery | Admitting: Neurosurgery

## 2024-09-11 ENCOUNTER — Encounter (HOSPITAL_COMMUNITY): Payer: Self-pay

## 2024-09-11 VITALS — BP 119/84 | HR 85 | Temp 97.9°F | Resp 17 | Ht 61.0 in | Wt 193.1 lb

## 2024-09-11 DIAGNOSIS — M48061 Spinal stenosis, lumbar region without neurogenic claudication: Secondary | ICD-10-CM | POA: Insufficient documentation

## 2024-09-11 DIAGNOSIS — Z01818 Encounter for other preprocedural examination: Secondary | ICD-10-CM | POA: Insufficient documentation

## 2024-09-11 LAB — CBC
HCT: 49 % — ABNORMAL HIGH (ref 36.0–46.0)
Hemoglobin: 16.1 g/dL — ABNORMAL HIGH (ref 12.0–15.0)
MCH: 28 pg (ref 26.0–34.0)
MCHC: 32.9 g/dL (ref 30.0–36.0)
MCV: 85.4 fL (ref 80.0–100.0)
Platelets: 303 K/uL (ref 150–400)
RBC: 5.74 MIL/uL — ABNORMAL HIGH (ref 3.87–5.11)
RDW: 14 % (ref 11.5–15.5)
WBC: 8.1 K/uL (ref 4.0–10.5)
nRBC: 0 % (ref 0.0–0.2)

## 2024-09-11 LAB — HEMOGLOBIN A1C
Hgb A1c MFr Bld: 5.8 % — ABNORMAL HIGH (ref 4.8–5.6)
Mean Plasma Glucose: 119.76 mg/dL

## 2024-09-11 LAB — BASIC METABOLIC PANEL WITH GFR
Anion gap: 12 (ref 5–15)
BUN: 10 mg/dL (ref 6–20)
CO2: 28 mmol/L (ref 22–32)
Calcium: 9.7 mg/dL (ref 8.9–10.3)
Chloride: 102 mmol/L (ref 98–111)
Creatinine, Ser: 0.83 mg/dL (ref 0.44–1.00)
GFR, Estimated: >60 mL/min (ref >60)
Glucose, Bld: 95 mg/dL (ref 70–99)
Potassium: 4 mmol/L (ref 3.5–5.1)
Sodium: 142 mmol/L (ref 135–145)

## 2024-09-11 LAB — SURGICAL PCR SCREEN
MRSA, PCR: NEGATIVE
Staphylococcus aureus: NEGATIVE

## 2024-09-11 LAB — GLUCOSE, CAPILLARY: Glucose-Capillary: 99 mg/dL (ref 70–99)

## 2024-09-11 NOTE — Progress Notes (Signed)
 Error

## 2024-09-11 NOTE — Progress Notes (Signed)
 PCP - Bernarda Das, NP Cardiologist - none  Chest x-ray - n/a EKG - 09/11/24 Stress Test - n/a ECHO - n/a Cardiac Cath - n/a  ICD Pacemaker/Loop - n/a  Sleep Study -  n/a  Sleep study scheduled in December 2025.  Diabetes Type 2 Fasting Blood Sugar - 80-100's Checks Blood Sugar 4 times a day  Hold Jardiance  3 days prior to instructions.  Last dose of Jardiance  will be on Thursday, 09/12/24.     Hold Trulicity  for 7 days prior to procedure. Last dose was on 08/31/24.  Aspirin & Blood Thinner Instructions:  n/a  NPO  Anesthesia review: no  STOP now taking any Aspirin (unless otherwise instructed by your surgeon), Aleve, Naproxen, Ibuprofen, Meloxicam , Motrin, Advil, Goody's, BC's, all herbal medications, fish oil, and all vitamins.   Coronavirus Screening Do you have any of the following symptoms:  Cough yes/no: No Fever (>100.20F)  yes/no: No Runny nose yes/no: No Sore throat yes/no: No Difficulty breathing/shortness of breath  yes/no: No  Have you traveled in the last 14 days and where? yes/no: No  Patient verbalized understanding of instructions that were given to them at the PAT appointment. Patient was also instructed that they will need to review over the PAT instructions again at home before surgery.

## 2024-09-13 NOTE — Progress Notes (Signed)
 Patient was called to be informed that the surgery time for Monday was changed to 13:30 o'clock. Patient was instructed to be at the hospital at 11:30 o'clock. Patient verbalized understanding.

## 2024-09-16 ENCOUNTER — Other Ambulatory Visit: Payer: Self-pay

## 2024-09-16 ENCOUNTER — Encounter (HOSPITAL_COMMUNITY): Payer: Self-pay | Admitting: Neurosurgery

## 2024-09-16 ENCOUNTER — Ambulatory Visit (HOSPITAL_COMMUNITY)

## 2024-09-16 ENCOUNTER — Ambulatory Visit (HOSPITAL_COMMUNITY)
Admission: RE | Admit: 2024-09-16 | Discharge: 2024-09-17 | Disposition: A | Discharge status: Home / Self Care | Attending: Neurosurgery | Admitting: Neurosurgery

## 2024-09-16 ENCOUNTER — Ambulatory Visit (HOSPITAL_COMMUNITY): Admission: RE | Disposition: A | Payer: Self-pay | Source: Home / Self Care | Attending: Neurosurgery

## 2024-09-16 DIAGNOSIS — Z7984 Long term (current) use of oral hypoglycemic drugs: Secondary | ICD-10-CM | POA: Diagnosis not present

## 2024-09-16 DIAGNOSIS — M4802 Spinal stenosis, cervical region: Secondary | ICD-10-CM | POA: Insufficient documentation

## 2024-09-16 DIAGNOSIS — E119 Type 2 diabetes mellitus without complications: Secondary | ICD-10-CM | POA: Insufficient documentation

## 2024-09-16 DIAGNOSIS — M4722 Other spondylosis with radiculopathy, cervical region: Secondary | ICD-10-CM | POA: Insufficient documentation

## 2024-09-16 DIAGNOSIS — M5412 Radiculopathy, cervical region: Secondary | ICD-10-CM | POA: Diagnosis not present

## 2024-09-16 DIAGNOSIS — F17210 Nicotine dependence, cigarettes, uncomplicated: Secondary | ICD-10-CM | POA: Diagnosis not present

## 2024-09-16 DIAGNOSIS — Z7985 Long-term (current) use of injectable non-insulin antidiabetic drugs: Secondary | ICD-10-CM | POA: Diagnosis not present

## 2024-09-16 DIAGNOSIS — I10 Essential (primary) hypertension: Secondary | ICD-10-CM | POA: Diagnosis not present

## 2024-09-16 HISTORY — PX: ANTERIOR CERVICAL DECOMP/DISCECTOMY FUSION: SHX1161

## 2024-09-16 LAB — GLUCOSE, CAPILLARY
Glucose-Capillary: 105 mg/dL — ABNORMAL HIGH (ref 70–99)
Glucose-Capillary: 78 mg/dL (ref 70–99)
Glucose-Capillary: 150 mg/dL — ABNORMAL HIGH (ref 70–99)

## 2024-09-16 SURGERY — ANTERIOR CERVICAL DECOMPRESSION/DISCECTOMY FUSION 2 LEVELS
Anesthesia: General

## 2024-09-16 MED ORDER — FENTANYL CITRATE (PF) 250 MCG/5ML IJ SOLN
INTRAMUSCULAR | Status: DC | PRN
  Administered 2024-09-16 (×2): 50 ug via INTRAVENOUS
  Administered 2024-09-16: 100 ug via INTRAVENOUS
  Administered 2024-09-16: 50 ug via INTRAVENOUS

## 2024-09-16 MED ORDER — ACETAMINOPHEN 325 MG PO TABS: 650.0000 mg | ORAL_TABLET | ORAL | Status: DC | PRN

## 2024-09-16 MED ORDER — 0.9 % SODIUM CHLORIDE (POUR BTL) OPTIME
TOPICAL | Status: DC | PRN
  Administered 2024-09-16: 1000 mL

## 2024-09-16 MED ORDER — PHENYLEPHRINE 80 MCG/ML (10ML) SYRINGE FOR IV PUSH (FOR BLOOD PRESSURE SUPPORT)
PREFILLED_SYRINGE | INTRAVENOUS | Status: AC
  Filled 2024-09-16: qty 10

## 2024-09-16 MED ORDER — FENTANYL CITRATE (PF) 100 MCG/2ML IJ SOLN
INTRAMUSCULAR | Status: AC
  Filled 2024-09-16: qty 2

## 2024-09-16 MED ORDER — ACETAMINOPHEN 650 MG RE SUPP: 650.0000 mg | RECTAL | Status: DC | PRN

## 2024-09-16 MED ORDER — ONDANSETRON HCL 4 MG/2ML IJ SOLN: 4.0000 mg | Freq: Four times a day (QID) | INTRAMUSCULAR | Status: DC | PRN

## 2024-09-16 MED ORDER — VENLAFAXINE HCL ER 75 MG PO CP24
75.0000 mg | ORAL_CAPSULE | Freq: Every day | ORAL | Status: DC
  Administered 2024-09-17: 75 mg via ORAL
  Filled 2024-09-16: qty 1

## 2024-09-16 MED ORDER — INSULIN ASPART 100 UNIT/ML IJ SOLN: 0.0000 [IU] | INTRAMUSCULAR | Status: DC | PRN

## 2024-09-16 MED ORDER — GLYCOPYRROLATE PF 0.2 MG/ML IJ SOSY
PREFILLED_SYRINGE | INTRAMUSCULAR | Status: DC | PRN
  Administered 2024-09-16: .2 mg via INTRAVENOUS

## 2024-09-16 MED ORDER — CEFAZOLIN SODIUM-DEXTROSE 2-4 GM/100ML-% IV SOLN
2.0000 g | INTRAVENOUS | Status: AC
  Administered 2024-09-16: 2 g via INTRAVENOUS
  Filled 2024-09-16: qty 100

## 2024-09-16 MED ORDER — ALUM & MAG HYDROXIDE-SIMETH 200-200-20 MG/5ML PO SUSP: 30.0000 mL | Freq: Four times a day (QID) | ORAL | Status: DC | PRN

## 2024-09-16 MED ORDER — DULAGLUTIDE 1.5 MG/0.5ML ~~LOC~~ SOAJ: 1.5000 mg | SUBCUTANEOUS | Status: DC

## 2024-09-16 MED ORDER — PANTOPRAZOLE SODIUM 40 MG IV SOLR
40.0000 mg | Freq: Every day | INTRAVENOUS | Status: DC
  Administered 2024-09-16: 40 mg via INTRAVENOUS
  Filled 2024-09-16: qty 10

## 2024-09-16 MED ORDER — SODIUM CHLORIDE 0.9% FLUSH: 3.0000 mL | INTRAVENOUS | Status: DC | PRN

## 2024-09-16 MED ORDER — OXYCODONE HCL 5 MG/5ML PO SOLN: 5.0000 mg | Freq: Once | ORAL | Status: DC | PRN

## 2024-09-16 MED ORDER — LIDOCAINE 2% (20 MG/ML) 5 ML SYRINGE
INTRAMUSCULAR | Status: DC | PRN
  Administered 2024-09-16: 80 mg via INTRAVENOUS

## 2024-09-16 MED ORDER — PHENOL 1.4 % MT LIQD
1.0000 | OROMUCOSAL | Status: DC | PRN
Start: 2024-09-16 — End: 2024-09-17

## 2024-09-16 MED ORDER — HYDROMORPHONE HCL 1 MG/ML IJ SOLN
INTRAMUSCULAR | Status: DC | PRN
  Administered 2024-09-16 (×2): .5 mg via INTRAVENOUS

## 2024-09-16 MED ORDER — ONDANSETRON HCL 4 MG/2ML IJ SOLN
INTRAMUSCULAR | Status: DC | PRN
  Administered 2024-09-16: 4 mg via INTRAVENOUS

## 2024-09-16 MED ORDER — THROMBIN 5000 UNITS EX SOLR
OROMUCOSAL | Status: DC | PRN
  Administered 2024-09-16: 5 mL via TOPICAL

## 2024-09-16 MED ORDER — OXYCODONE HCL 5 MG PO TABS: 5.0000 mg | ORAL_TABLET | Freq: Once | ORAL | Status: DC | PRN

## 2024-09-16 MED ORDER — THROMBIN 5000 UNITS EX KIT
PACK | CUTANEOUS | Status: AC
  Filled 2024-09-16: qty 2

## 2024-09-16 MED ORDER — HYDROMORPHONE HCL 1 MG/ML IJ SOLN
0.5000 mg | INTRAMUSCULAR | Status: DC | PRN
  Administered 2024-09-16 – 2024-09-17 (×3): 0.5 mg via INTRAVENOUS
  Filled 2024-09-16 (×3): qty 0.5

## 2024-09-16 MED ORDER — ORAL CARE MOUTH RINSE: 15.0000 mL | Freq: Once | OROMUCOSAL | Status: AC

## 2024-09-16 MED ORDER — GLYCOPYRROLATE PF 0.2 MG/ML IJ SOSY
PREFILLED_SYRINGE | INTRAMUSCULAR | Status: AC
  Filled 2024-09-16: qty 1

## 2024-09-16 MED ORDER — DEXAMETHASONE SOD PHOSPHATE PF 10 MG/ML IJ SOLN
INTRAMUSCULAR | Status: DC | PRN
  Administered 2024-09-16: 10 mg via INTRAVENOUS

## 2024-09-16 MED ORDER — AMLODIPINE BESYLATE 10 MG PO TABS
10.0000 mg | ORAL_TABLET | Freq: Every day | ORAL | Status: DC
  Administered 2024-09-17: 10 mg via ORAL
  Filled 2024-09-16: qty 1

## 2024-09-16 MED ORDER — MIDAZOLAM HCL 2 MG/2ML IJ SOLN
INTRAMUSCULAR | Status: AC
  Filled 2024-09-16: qty 2

## 2024-09-16 MED ORDER — SUGAMMADEX SODIUM 200 MG/2ML IV SOLN
INTRAVENOUS | Status: DC | PRN
  Administered 2024-09-16: 350 mg via INTRAVENOUS

## 2024-09-16 MED ORDER — PROPOFOL 10 MG/ML IV BOLUS
INTRAVENOUS | Status: DC | PRN
  Administered 2024-09-16: 100 mg via INTRAVENOUS
  Administered 2024-09-16 (×2): 50 mg via INTRAVENOUS

## 2024-09-16 MED ORDER — CEFAZOLIN SODIUM-DEXTROSE 2-4 GM/100ML-% IV SOLN
2.0000 g | Freq: Three times a day (TID) | INTRAVENOUS | Status: AC
  Administered 2024-09-16 – 2024-09-17 (×2): 2 g via INTRAVENOUS
  Filled 2024-09-16 (×2): qty 100

## 2024-09-16 MED ORDER — CHLORHEXIDINE GLUCONATE 0.12 % MT SOLN
15.0000 mL | Freq: Once | OROMUCOSAL | Status: AC
  Administered 2024-09-16: 15 mL via OROMUCOSAL
  Filled 2024-09-16: qty 15

## 2024-09-16 MED ORDER — LACTATED RINGERS IV SOLN: INTRAVENOUS | Status: DC

## 2024-09-16 MED ORDER — SIMVASTATIN 5 MG PO TABS
5.0000 mg | ORAL_TABLET | Freq: Every day | ORAL | Status: DC
  Administered 2024-09-17: 5 mg via ORAL
  Filled 2024-09-16: qty 1

## 2024-09-16 MED ORDER — ONDANSETRON HCL 4 MG PO TABS: 4.0000 mg | ORAL_TABLET | Freq: Four times a day (QID) | ORAL | Status: DC | PRN

## 2024-09-16 MED ORDER — SODIUM CHLORIDE 0.9% FLUSH
3.0000 mL | Freq: Two times a day (BID) | INTRAVENOUS | Status: DC
  Administered 2024-09-16: 3 mL via INTRAVENOUS

## 2024-09-16 MED ORDER — EMPAGLIFLOZIN 10 MG PO TABS
10.0000 mg | ORAL_TABLET | Freq: Every day | ORAL | Status: DC
  Administered 2024-09-17: 10 mg via ORAL
  Filled 2024-09-16: qty 1

## 2024-09-16 MED ORDER — CLONAZEPAM 0.5 MG PO TABS
0.5000 mg | ORAL_TABLET | Freq: Four times a day (QID) | ORAL | Status: DC
  Administered 2024-09-17 (×2): 0.5 mg via ORAL
  Filled 2024-09-16 (×2): qty 1

## 2024-09-16 MED ORDER — HYDROCODONE-ACETAMINOPHEN 5-325 MG PO TABS
2.0000 | ORAL_TABLET | ORAL | Status: DC | PRN
  Administered 2024-09-16 – 2024-09-17 (×3): 2 via ORAL
  Filled 2024-09-16 (×3): qty 2

## 2024-09-16 MED ORDER — PROPOFOL 10 MG/ML IV BOLUS
INTRAVENOUS | Status: AC
  Filled 2024-09-16: qty 20

## 2024-09-16 MED ORDER — CYCLOBENZAPRINE HCL 10 MG PO TABS
10.0000 mg | ORAL_TABLET | Freq: Three times a day (TID) | ORAL | Status: DC | PRN
Start: 2024-09-16 — End: 2024-09-17
  Administered 2024-09-16: 10 mg via ORAL
  Filled 2024-09-16: qty 1

## 2024-09-16 MED ORDER — CHLORHEXIDINE GLUCONATE CLOTH 2 % EX PADS: 6.0000 | MEDICATED_PAD | Freq: Once | CUTANEOUS | Status: DC

## 2024-09-16 MED ORDER — ROCURONIUM BROMIDE 10 MG/ML (PF) SYRINGE
PREFILLED_SYRINGE | INTRAVENOUS | Status: AC
  Filled 2024-09-16: qty 10

## 2024-09-16 MED ORDER — HYDROMORPHONE HCL 1 MG/ML IJ SOLN
INTRAMUSCULAR | Status: AC
  Filled 2024-09-16: qty 0.5

## 2024-09-16 MED ORDER — ONDANSETRON HCL 4 MG/2ML IJ SOLN
INTRAMUSCULAR | Status: AC
  Filled 2024-09-16: qty 2

## 2024-09-16 MED ORDER — ROCURONIUM BROMIDE 10 MG/ML (PF) SYRINGE
PREFILLED_SYRINGE | INTRAVENOUS | Status: DC | PRN
  Administered 2024-09-16: 10 mg via INTRAVENOUS
  Administered 2024-09-16: 60 mg via INTRAVENOUS
  Administered 2024-09-16: 30 mg via INTRAVENOUS

## 2024-09-16 MED ORDER — PHENYLEPHRINE HCL-NACL 20-0.9 MG/250ML-% IV SOLN
INTRAVENOUS | Status: DC | PRN
Start: 2024-09-16 — End: 2024-09-16
  Administered 2024-09-16: 50 ug/min via INTRAVENOUS

## 2024-09-16 MED ORDER — LIDOCAINE 2% (20 MG/ML) 5 ML SYRINGE
INTRAMUSCULAR | Status: AC
  Filled 2024-09-16: qty 5

## 2024-09-16 MED ORDER — SODIUM CHLORIDE 0.9 % IV SOLN
250.0000 mL | INTRAVENOUS | Status: DC
  Administered 2024-09-16: 250 mL via INTRAVENOUS

## 2024-09-16 MED ORDER — PROPOFOL 10 MG/ML IV BOLUS
INTRAVENOUS | Status: AC
Start: 2024-09-16 — End: 2024-09-16
  Filled 2024-09-16: qty 20

## 2024-09-16 MED ORDER — FENTANYL CITRATE (PF) 100 MCG/2ML IJ SOLN
25.0000 ug | INTRAMUSCULAR | Status: DC | PRN
  Administered 2024-09-16 (×2): 25 ug via INTRAVENOUS
  Administered 2024-09-16: 50 ug via INTRAVENOUS
  Administered 2024-09-16 (×2): 25 ug via INTRAVENOUS

## 2024-09-16 MED ORDER — MIDAZOLAM HCL (PF) 2 MG/2ML IJ SOLN
INTRAMUSCULAR | Status: DC | PRN
  Administered 2024-09-16: 2 mg via INTRAVENOUS

## 2024-09-16 MED ORDER — THROMBIN (RECOMBINANT) 5000 UNITS EX SOLR
CUTANEOUS | Status: DC | PRN
  Administered 2024-09-16: 10 mL via TOPICAL

## 2024-09-16 MED ORDER — FENTANYL CITRATE (PF) 250 MCG/5ML IJ SOLN
INTRAMUSCULAR | Status: AC
  Filled 2024-09-16: qty 5

## 2024-09-16 MED ORDER — MENTHOL 3 MG MT LOZG: 1.0000 | LOZENGE | OROMUCOSAL | Status: DC | PRN

## 2024-09-16 MED ORDER — THROMBIN 5000 UNITS EX KIT
PACK | CUTANEOUS | Status: AC
  Filled 2024-09-16: qty 1

## 2024-09-16 SURGICAL SUPPLY — 47 items
BAG COUNTER SPONGE SURGICOUNT (BAG) ×1 IMPLANT
BAND RUBBER #18 3X1/16 STRL (MISCELLANEOUS) ×2 IMPLANT
BASKET BONE COLLECTION (BASKET) ×1 IMPLANT
BENZOIN TINCTURE PRP APPL 2/3 (GAUZE/BANDAGES/DRESSINGS) ×1 IMPLANT
BIT DRILL NEURO 2X3.1 SFT TUCH (MISCELLANEOUS) ×1 IMPLANT
BUR MATCHSTICK NEURO 3.0 LAGG (BURR) ×1 IMPLANT
CANISTER SUCTION 3000ML PPV (SUCTIONS) ×1 IMPLANT
CLSR STERI-STRIP ANTIMIC 1/2X4 (GAUZE/BANDAGES/DRESSINGS) IMPLANT
DERMABOND ADVANCED .7 DNX12 (GAUZE/BANDAGES/DRESSINGS) IMPLANT
DRAPE C-ARM 42X72 X-RAY (DRAPES) ×2 IMPLANT
DRAPE LAPAROTOMY 100X72 PEDS (DRAPES) ×1 IMPLANT
DRAPE MICROSCOPE LEICA (MISCELLANEOUS) ×1 IMPLANT
DRSG OPSITE POSTOP 4X6 (GAUZE/BANDAGES/DRESSINGS) IMPLANT
DURAPREP 6ML APPLICATOR 50/CS (WOUND CARE) ×1 IMPLANT
ELECT COATED BLADE 2.86 ST (ELECTRODE) ×1 IMPLANT
ELECTRODE REM PT RTRN 9FT ADLT (ELECTROSURGICAL) ×1 IMPLANT
GAUZE 4X4 16PLY ~~LOC~~+RFID DBL (SPONGE) IMPLANT
GAUZE SPONGE 4X4 12PLY STRL (GAUZE/BANDAGES/DRESSINGS) ×1 IMPLANT
GLOVE BIO SURGEON STRL SZ7 (GLOVE) IMPLANT
GLOVE BIO SURGEON STRL SZ8 (GLOVE) ×1 IMPLANT
GLOVE BIOGEL PI IND STRL 7.0 (GLOVE) IMPLANT
GLOVE INDICATOR 8.5 STRL (GLOVE) ×1 IMPLANT
GOWN STRL REUS W/ TWL LRG LVL3 (GOWN DISPOSABLE) ×1 IMPLANT
GOWN STRL REUS W/ TWL XL LVL3 (GOWN DISPOSABLE) ×1 IMPLANT
GOWN STRL REUS W/TWL 2XL LVL3 (GOWN DISPOSABLE) IMPLANT
GRAFT BNE MATRIX VG FRMBL SM 1 (Bone Implant) IMPLANT
HALTER HD/CHIN CERV TRACTION D (MISCELLANEOUS) ×1 IMPLANT
HEMOSTAT POWDER KIT SURGIFOAM (HEMOSTASIS) ×1 IMPLANT
KIT BASIN OR (CUSTOM PROCEDURE TRAY) ×1 IMPLANT
KIT TURNOVER KIT B (KITS) ×1 IMPLANT
NDL SPNL 20GX3.5 QUINCKE YW (NEEDLE) ×1 IMPLANT
PACK LAMINECTOMY NEURO (CUSTOM PROCEDURE TRAY) ×1 IMPLANT
PAD ARMBOARD POSITIONER FOAM (MISCELLANEOUS) ×3 IMPLANT
PIN DISTRACTION 14MM (PIN) IMPLANT
PLATE CERV RES 30 2L (Plate) IMPLANT
SCREW VA SD RESONATE 4.2X14 (Screw) IMPLANT
SOLN 0.9% NACL POUR BTL 1000ML (IV SOLUTION) ×1 IMPLANT
SOLN STERILE WATER BTL 1000 ML (IV SOLUTION) ×1 IMPLANT
SPACER HEDRON C 12X14X6 0D (Spacer) IMPLANT
SPONGE INTESTINAL PEANUT (DISPOSABLE) ×1 IMPLANT
SPONGE SURGIFOAM ABS GEL SZ50 (HEMOSTASIS) ×1 IMPLANT
STRIP CLOSURE SKIN 1/2X4 (GAUZE/BANDAGES/DRESSINGS) ×1 IMPLANT
SUT VIC AB 3-0 SH 8-18 (SUTURE) ×1 IMPLANT
SUT VIC AB 4-0 PS2 27 (SUTURE) ×1 IMPLANT
TAPE CLOTH 4X10 WHT NS (GAUZE/BANDAGES/DRESSINGS) ×1 IMPLANT
TOWEL GREEN STERILE (TOWEL DISPOSABLE) ×1 IMPLANT
TOWEL GREEN STERILE FF (TOWEL DISPOSABLE) ×1 IMPLANT

## 2024-09-16 NOTE — Op Note (Signed)
 Preoperative diagnosis: Cervical spondylosis with stenosis cord compression and radiculopathy at C5-6 and C6-7.  Postoperative diagnosis: Same.  Procedure: Anterior cervical discectomy and fusion at C5-6 and C6-7 utilizing petering titanium cages packed with locally harvested autograft mixed with Vivigen and anterior cervical plating utilizing the globus resonate plating system.  Surgeon: Arley helling.  Assistant: Suzen Click.  Anesthesia: General.  EBL: Minimal.  HPI: 48 year old female progressive worsening neck bilateral shoulder and arm pain workup revealed severe cervical spondylosis disc herniation and bone spurs causing cord compression at C5-6 primarily but also at C6-7 with foraminal stenosis at both levels.  Due to the patient's progression of clinical syndrome imaging findings of failed conservative treatment I recommend anterior cervical discectomies and fusion at those 2 levels.  I extensively reviewed the risks and benefits of the operation with the patient as well as perioperative course expectations of outcome and alternatives to surgery and she understood and agreed to proceed forward.  Operative procedure: Patient was brought into the OR was induced by general anesthesia positioned with the neck in slight extension 5 pounds of halter traction.  The right side of her neck was prepped and draped in routine sterile fashion preoperative x-ray localized the appropriate level so a curvilinear incision was made just off the midline to the anter border of the sternocleidomastoid and the superficial abscess was dissected out divided longitudinally the avascular plane between the sternomastoid and strap was was developed down to the prevertebral fascia and prevertebral fascia was dissected away with Kitners.  Interoperative x-ray confirmed identification appropriate level with a marker at the disc base superior to allow visualization.  So annulotomy's were made at the 2 to space below the  C5-6 C6-7 lungs go deflected laterally and self-retaining retractor was placed.  Anterior ossified to a bit off of the Leksell rongeur and 2 and 3 and a Kerrison punch.  Both the space were drilled down to The Bone Shavings the Mucus Trap and under Microscope Lamination First Working at C6-7 with Horticulturist, Commercial in Place the Space Was Drilled down There Was 6 Extensive Osteophytes Coming off the Endplate This Was All Aggressively under Bitton Marching Laterally Both C7 Pedicles Were Identified and Both C7 Nerve Roots Were Decompressed and Skeletonized Flush with the Pedicle.  After Adequate Both Central and Foraminal Decompression and and and Endplate Preparation I Sized up a 6 Mm Titanium Cage Packed with Locally Harvested Autograft Mixed with Vivigen with distractor pins in place the space was drilled down there was 6 extensive osteophytes coming off the endplate this was all aggressively under Bitton marching laterally both C7 pedicles were identified and both C7 nerve roots were decompressed and skeletonized flush with the pedicle.  After adequate both central and foraminal decompression and and and endplate preparation I sized up a 6 mm titanium cage packed with locally harvested autograft mixed with Vivigen and inserted it.  I then repositioned the Caspar pins removed them attention taken at C5-6 in a similar fashion all the spurs were drilled away The Bone Shavings and Mucus Trap Large Posterior Spurring Primarily to the Right Coming off the Uncinate and Both Endplates Was All Aggressively Removed Decompressing Both C6 Nerve Roots and Skeletonized and Flush with the Pedicle.  At the End of Decompression There Is No Further Stenosis Sized up Another 6 Mm Cage Inserted This and Then Selected a 30 Mm Globus Resonate Plate All Screws with Excellent Purchase Postop Imaging Confirmed Good Position of All the Implants the Wound Was Then Copiously  Irrigated 6 Hemostasis Was Maintained in Position Was Reapproximated  with Interrupted Vicryl Skin Was Closed Running 4 Subcuticular Dermabond Benzoin Steri-Strips and a Sterile Dressing Was Applied Patient to Cover Him in Stable Condition.  At the End the Case All Needle Count Sponge Counts Were Correct.

## 2024-09-16 NOTE — Transfer of Care (Signed)
 Immediate Anesthesia Transfer of Care Note  Patient: Kristi Daniels  Procedure(s) Performed: ANTERIOR CERVICAL DECOMPRESSION/DISCECTOMY FUSION CERIVCAL FIVE-SIX, CERVICAL SIX- SEVEN  Patient Location: PACU  Anesthesia Type:General  Level of Consciousness: awake, alert , and oriented  Airway & Oxygen Therapy: Patient Spontanous Breathing and Patient connected to face mask oxygen  Post-op Assessment: Report given to RN and Post -op Vital signs reviewed and stable  Post vital signs: Reviewed and stable  Last Vitals:  Vitals Value Taken Time  BP 146/78 09/16/24 17:19  Temp 37 C 09/16/24 17:19  Pulse 110 09/16/24 17:27  Resp 22 09/16/24 17:27  SpO2 100 % 09/16/24 17:27  Vitals shown include unfiled device data.  Last Pain:  Vitals:   09/16/24 1719  TempSrc:   PainSc: Asleep      Patients Stated Pain Goal: 3 (09/16/24 1147)  Complications: No notable events documented.

## 2024-09-16 NOTE — Anesthesia Preprocedure Evaluation (Signed)
 Anesthesia Evaluation  Patient identified by MRN, date of birth, ID band Patient awake    Reviewed: Allergy & Precautions, H&P , NPO status , Patient's Chart, lab work & pertinent test results  Airway Mallampati: II   Neck ROM: full    Dental   Pulmonary Current Smoker   breath sounds clear to auscultation       Cardiovascular hypertension,  Rhythm:regular Rate:Normal     Neuro/Psych    GI/Hepatic   Endo/Other  diabetes, Type 2    Renal/GU      Musculoskeletal  (+) Arthritis ,    Abdominal   Peds  Hematology   Anesthesia Other Findings   Reproductive/Obstetrics                              Anesthesia Physical Anesthesia Plan  ASA: 2  Anesthesia Plan: General   Post-op Pain Management:    Induction: Intravenous  PONV Risk Score and Plan: 2 and Ondansetron , Dexamethasone , Midazolam  and Treatment may vary due to age or medical condition  Airway Management Planned: Oral ETT and Video Laryngoscope Planned  Additional Equipment:   Intra-op Plan:   Post-operative Plan: Extubation in OR  Informed Consent: I have reviewed the patients History and Physical, chart, labs and discussed the procedure including the risks, benefits and alternatives for the proposed anesthesia with the patient or authorized representative who has indicated his/her understanding and acceptance.     Dental advisory given  Plan Discussed with: CRNA, Anesthesiologist and Surgeon  Anesthesia Plan Comments:         Anesthesia Quick Evaluation

## 2024-09-16 NOTE — H&P (Signed)
 Kristi Daniels is an 48 y.o. female.   Chief Complaint: Neck and bilateral shoulder and arm pain HPI: 48 year old female with neck and bilateral shoulder and arm pain workup revealed severe cervical spondylosis cord compression severe foraminal stenosis primarily at C5-6 but also at C6-7.  Due to patient's progression of clinical syndrome imaging findings of failed conservative treatment I recommended anterior cervical discectomies and fusion at those 2 levels.  I extensively reviewed the risks and benefits of the operation with the patient as well as perioperative course expectations of outcome and alternatives to surgery and she understood and agreed to proceed forward.  Past Medical History:  Diagnosis Date   Arthritis    in back   Cervical radiculopathy at C6 04/18/2024   Diabetes mellitus without complication (HCC)    type 2   History of blood transfusion 05/2021   Hypertension    type 2    Past Surgical History:  Procedure Laterality Date   BACK SURGERY     L4-L5 w/fusion   CESAREAN SECTION  2004   COLONOSCOPY     JOINT REPLACEMENT Left    elbow    TUBAL LIGATION  2004    Family History  Problem Relation Age of Onset   Diabetes Mother    Hypertension Mother    Social History:  reports that she has been smoking cigarettes. She has never used smokeless tobacco. She reports that she does not currently use alcohol. She reports that she does not use drugs.  Allergies: No Known Allergies  Medications Prior to Admission  Medication Sig Dispense Refill   amLODipine  (NORVASC ) 10 MG tablet Take 1 tablet (10 mg total) by mouth daily. 30 tablet 0   clonazePAM  (KLONOPIN ) 0.5 MG tablet Take 0.5 mg by mouth every 6 (six) hours.     cyclobenzaprine  (FLEXERIL ) 10 MG tablet Take 1 tablet (10 mg total) by mouth 3 (three) times daily as needed for muscle spasms. 30 tablet 0   Dulaglutide  1.5 MG/0.5ML SOAJ Inject 1.5 mg into the skin every Saturday.     empagliflozin  (JARDIANCE ) 10 MG  TABS tablet Take by mouth daily.     meloxicam  (MOBIC ) 7.5 MG tablet Take 7.5 mg by mouth in the morning and at bedtime.     simvastatin  (ZOCOR ) 5 MG tablet Take 5 mg by mouth daily.     venlafaxine  XR (EFFEXOR -XR) 75 MG 24 hr capsule Take 75 mg by mouth daily.      Results for orders placed or performed during the hospital encounter of 09/16/24 (from the past 48 hours)  Glucose, capillary     Status: Abnormal   Collection Time: 09/16/24 11:25 AM  Result Value Ref Range   Glucose-Capillary 105 (H) 70 - 99 mg/dL    Comment: Glucose reference range applies only to samples taken after fasting for at least 8 hours.   Comment 1 Notify RN   Glucose, capillary     Status: None   Collection Time: 09/16/24  1:25 PM  Result Value Ref Range   Glucose-Capillary 78 70 - 99 mg/dL    Comment: Glucose reference range applies only to samples taken after fasting for at least 8 hours.   Comment 1 Notify RN    No results found.  Review of Systems  Musculoskeletal:  Positive for neck pain.  Neurological:  Positive for numbness.    Blood pressure 139/82, pulse 93, temperature 98.7 F (37.1 C), temperature source Oral, resp. rate 18, height 5' 1 (1.549 m), weight  87.5 kg, last menstrual period 11/27/2019, SpO2 96%. Physical Exam HENT:     Head: Normocephalic.     Right Ear: Tympanic membrane normal.     Nose: Nose normal.  Cardiovascular:     Rate and Rhythm: Normal rate.     Pulses: Normal pulses.  Pulmonary:     Effort: Pulmonary effort is normal.  Musculoskeletal:        General: Normal range of motion.     Cervical back: Normal range of motion.  Skin:    General: Skin is warm.  Neurological:     Mental Status: She is alert.     Comments: Strength is 5 out of 5 deltoid, bicep, tricep, wrist flexion, wrist extension, hand intrinsics.      Assessment/Plan 48 year old presents for ACDF C5-6 C6-7  Arley SHAUNNA Helling, MD 09/16/2024, 2:22 PM

## 2024-09-16 NOTE — Anesthesia Procedure Notes (Addendum)
 Procedure Name: Intubation Date/Time: 09/16/2024 2:43 PM  Performed by: Myrna Homer, CRNAPre-anesthesia Checklist: Patient identified, Emergency Drugs available, Suction available and Patient being monitored Patient Re-evaluated:Patient Re-evaluated prior to induction Oxygen Delivery Method: Circle System Utilized Preoxygenation: Pre-oxygenation with 100% oxygen Induction Type: IV induction Ventilation: Mask ventilation without difficulty and Oral airway inserted - appropriate to patient size Laryngoscope Size: Glidescope and 3 Grade View: Grade I Tube type: Oral Tube size: 7.0 mm Number of attempts: 1 Airway Equipment and Method: Stylet and Oral airway Placement Confirmation: ETT inserted through vocal cords under direct vision, positive ETCO2 and breath sounds checked- equal and bilateral Secured at: 21 cm Tube secured with: Tape Dental Injury: Teeth and Oropharynx as per pre-operative assessment  Comments: Care taken to minimize any neck hyperextension during induction/intubation sequence.

## 2024-09-17 DIAGNOSIS — M4722 Other spondylosis with radiculopathy, cervical region: Secondary | ICD-10-CM | POA: Diagnosis not present

## 2024-09-17 MED ORDER — HYDROCODONE-ACETAMINOPHEN 5-325 MG PO TABS: 1.0000 | ORAL_TABLET | ORAL | 0 refills | Status: AC | PRN

## 2024-09-17 MED ORDER — CYCLOBENZAPRINE HCL 10 MG PO TABS: 10.0000 mg | ORAL_TABLET | Freq: Three times a day (TID) | ORAL | 0 refills | Status: AC | PRN

## 2024-09-17 MED FILL — Thrombin For Soln 5000 Unit: CUTANEOUS | Qty: 2 | Status: AC

## 2024-09-17 NOTE — Evaluation (Signed)
 Occupational Therapy Evaluation Patient Details Name: Kristi Daniels MRN: [id number] DOB: [birth year] Today's Date: 09/17/2024   History of Present Illness   YNEZ EUGENIO is a 48 yo female who is s/p ACDF C5-7 11/24. PMHx significant for DMII and HTN.     Clinical Impressions Mrytle was evaluated s/p the above spine surgery. She is indep at baseline. Upon evaluation pt was limited by chronic BUE diminished sensation, neck pain, spinal precautions and decreased activity tolerance. Overall she demonstrated mod I ability to complete mobility and ADLs without DME. Provided cues and education on spinal precautions and compensatory techniques throughout, handout provided and pt demonstrated great recall during ADLs and mobility. Order states no brace needed, soft collar donned for comfort. Pt does not require further acute OT services. Recommend d/c home with support of family.         Functional Status Assessment   Patient has had a recent decline in their functional status and demonstrates the ability to make significant improvements in function in a reasonable and predictable amount of time.     Equipment Recommendations   None recommended by OT      Precautions/Restrictions   Precautions Precautions: Fall;Cervical Precaution Booklet Issued: Yes (comment) Recall of Precautions/Restrictions: Intact Required Braces or Orthoses: Cervical Brace Cervical Brace: Soft collar Restrictions Weight Bearing Restrictions Per Provider Order: No     Mobility Bed Mobility Overal bed mobility: Needs Assistance             General bed mobility comments: OOB on arrival, verbally reviewed log roll    Transfers Overall transfer level: Modified independent                 General transfer comment: no AD      Balance Overall balance assessment: Modified Independent           ADL either performed or assessed with clinical judgement   ADL Overall ADL's :  Modified independent             General ADL Comments: mod I after review of spinal precuations and compensatory techniques     Vision Baseline Vision/History: 0 No visual deficits Vision Assessment?: No apparent visual deficits     Perception Perception: Within Functional Limits       Praxis Praxis: WFL       Pertinent Vitals/Pain Pain Assessment Pain Assessment: Faces Faces Pain Scale: Hurts little more Pain Location: cervical Pain Descriptors / Indicators: Discomfort, Sore Pain Intervention(s): Limited activity within patient's tolerance, Monitored during session     Extremity/Trunk Assessment Upper Extremity Assessment Upper Extremity Assessment: Generalized weakness (limited overhead ROM due to pain)   Lower Extremity Assessment Lower Extremity Assessment: Overall WFL for tasks assessed   Cervical / Trunk Assessment Cervical / Trunk Assessment: Neck Surgery   Communication Communication Communication: No apparent difficulties   Cognition Arousal: Alert Behavior During Therapy: WFL for tasks assessed/performed Cognition: No apparent impairments               Following commands: Intact       Cueing  General Comments   Cueing Techniques: Verbal cues  VSS , reviewed soft collar           Home Living Family/patient expects to be discharged to:: Private residence Living Arrangements: Parent Available Help at Discharge: Family;Available 24 hours/day;Available PRN/intermittently Type of Home: Apartment Home Access: Stairs to enter Entrance Stairs-Number of Steps: 6? Entrance Stairs-Rails: Left;Right (pt states rails are janky) Home Layout: One level  Bathroom Shower/Tub: Doctor, General Practice: None          Prior Functioning/Environment Prior Level of Function : Independent/Modified Independent;Driving                    OT Problem List: Decreased strength;Decreased range of  motion;Decreased activity tolerance;Impaired balance (sitting and/or standing);Decreased safety awareness;Decreased knowledge of use of DME or AE;Decreased knowledge of precautions        OT Goals(Current goals can be found in the care plan section)   Acute Rehab OT Goals Patient Stated Goal: home OT Goal Formulation: With patient Time For Goal Achievement: 10/01/24 Potential to Achieve Goals: Good   AM-PAC OT 6 Clicks Daily Activity     Outcome Measure Help from another person eating meals?: None Help from another person taking care of personal grooming?: None Help from another person toileting, which includes using toliet, bedpan, or urinal?: None Help from another person bathing (including washing, rinsing, drying)?: None Help from another person to put on and taking off regular upper body clothing?: None Help from another person to put on and taking off regular lower body clothing?: None 6 Click Score: 24   End of Session Nurse Communication: Mobility status  Activity Tolerance: Patient tolerated treatment well Patient left: in bed;with call bell/phone within reach  OT Visit Diagnosis: Unsteadiness on feet (R26.81);Other abnormalities of gait and mobility (R26.89);Muscle weakness (generalized) (M62.81)                Time: 9164-9147 OT Time Calculation (min): 17 min Charges:  OT General Charges $OT Visit: 1 Visit OT Evaluation $OT Eval Low Complexity: 1 Low  Lucie Kendall, OTR/L Acute Rehabilitation Services Office [phone] Secure Chat Communication Preferred   Lucie JONETTA Kendall 09/17/2024, 9:33 AM

## 2024-09-17 NOTE — Progress Notes (Signed)
 [facility]  PROCEDURAL EXPEDITER PROGRESS NOTE  Patient Name: Kristi Daniels  DOB:[birth year] Date of Admission: 09/16/2024  Date of Assessment:09/17/24   ------------------------------------------------------------------------------------------------------------------- No Barrier noted at this, orders in for d/c home  -------------------------------------------------------------------------------------------------------------------  [facility] Expediter, Ronal DELENA Bald Please contact us  directly via secure chat (search for [facility]) or by calling us  at [phone] [facility]).

## 2024-09-17 NOTE — Anesthesia Postprocedure Evaluation (Signed)
 Anesthesia Post Note  Patient: Kristi Daniels  Procedure(s) Performed: ANTERIOR CERVICAL DECOMPRESSION/DISCECTOMY FUSION CERIVCAL FIVE-SIX, CERVICAL SIX- SEVEN     Patient location during evaluation: PACU Anesthesia Type: General Level of consciousness: awake and alert Pain management: pain level controlled Vital Signs Assessment: post-procedure vital signs reviewed and stable Respiratory status: spontaneous breathing, nonlabored ventilation and respiratory function stable Cardiovascular status: blood pressure returned to baseline and stable Postop Assessment: no apparent nausea or vomiting Anesthetic complications: no   No notable events documented.  Last Vitals:  Vitals:   09/17/24 0333 09/17/24 0745  BP: 137/74 (!) 145/80  Pulse: 84 88  Resp: 17 17  Temp: 36.6 C 36.7 C  SpO2: 100% 99%    Last Pain:  Vitals:   09/17/24 1026  TempSrc:   PainSc: 2    Pain Goal: Patients Stated Pain Goal: 3 (09/16/24 1147)                 Butler Levander Pinal

## 2024-09-17 NOTE — Discharge Summary (Signed)
 Physician Discharge Summary  Patient ID: Kristi Daniels MRN: [id number] DOB/AGE: 1976/04/27 48 y.o.  Admit date: 09/16/2024 Discharge date: 09/17/2024  Admission Diagnoses: Cervical spondylosis with stenosis cord compression and radiculopathy at C5-6 and C6-7.     Discharge Diagnoses: same   Discharged Condition: good  Hospital Course: The patient was admitted on 09/16/2024 and taken to the operating room where the patient underwent acdf C5-6, C6-7. The patient tolerated the procedure well and was taken to the recovery room and then to the floor in stable condition. The hospital course was routine. There were no complications. The wound remained clean dry and intact. Pt had appropriate neck soreness. No complaints of arm pain or new N/T/W. The patient remained afebrile with stable vital signs, and tolerated a regular diet. The patient continued to increase activities, and pain was well controlled with oral pain medications.   Consults: None  Significant Diagnostic Studies:  Results for orders placed or performed during the hospital encounter of 09/16/24  Glucose, capillary   Collection Time: 09/16/24 11:25 AM  Result Value Ref Range   Glucose-Capillary 105 (H) 70 - 99 mg/dL   Comment 1 Notify RN   Glucose, capillary   Collection Time: 09/16/24  1:25 PM  Result Value Ref Range   Glucose-Capillary 78 70 - 99 mg/dL   Comment 1 Notify RN   Glucose, capillary   Collection Time: 09/16/24  5:23 PM  Result Value Ref Range   Glucose-Capillary 150 (H) 70 - 99 mg/dL    DG Cervical Spine 1 View Result Date: 09/16/2024 EXAM: FLUOROSCOPIC IMAGES, 3 views. TECHNIQUE: Fluoroscopy was provided by the radiology department for procedure. Radiologist was not present during examination. FLUOROSCOPY DOSE AND TYPE: Radiation Dose Index: Reference Air Kerma (in mGy) = 6.37. Fluoroscopy time: 19 seconds. COMPARISON: None available. CLINICAL HISTORY: Cervical Fusion FINDINGS: Intraoperative  fluoroscopic imaging was performed. Changes of interbody fusion at C5-C6 and C6-C7 with anterior fixation are seen. No soft tissue changes are noted. IMPRESSION: 1. Cervical fusion from C5 to C7 NOTE: Intraoperative fluoroscopic spot images as above. Please refer to the intraoperative report for full details. Electronically signed by: Oneil Devonshire MD 09/16/2024 09:32 PM EST RP Workstation: [id number] BARE C-Arm 1-60 Min-No Report Result Date: 09/16/2024 Fluoroscopy was utilized by the requesting physician.  No radiographic interpretation.   DG C-Arm 1-60 Min-No Report Result Date: 09/16/2024 Fluoroscopy was utilized by the requesting physician.  No radiographic interpretation.   DG C-Arm 1-60 Min-No Report Result Date: 09/16/2024 Fluoroscopy was utilized by the requesting physician.  No radiographic interpretation.    Antibiotics:  Anti-infectives (From admission, onward)    Start     Dose/Rate Route Frequency Ordered Stop   09/16/24 2100  ceFAZolin  (ANCEF ) IVPB 2g/100 mL premix        2 g 200 mL/hr over 30 Minutes Intravenous Every 8 hours 09/16/24 2010 09/17/24 0556   09/16/24 1145  ceFAZolin  (ANCEF ) IVPB 2g/100 mL premix        2 g 200 mL/hr over 30 Minutes Intravenous On call to O.R. 09/16/24 1140 09/16/24 1447       Discharge Exam: Blood pressure (!) 145/80, pulse 88, temperature 98.1 F (36.7 C), resp. rate 17, height 5' 1 (1.549 m), weight 87.5 kg, last menstrual period 11/27/2019, SpO2 99%. Neurologic: Grossly normal Ambulating and voiding well incision cdi   Discharge Medications:   Allergies as of 09/17/2024   No Known Allergies      Medication List  TAKE these medications    amLODipine  10 MG tablet Commonly known as: NORVASC  Take 1 tablet (10 mg total) by mouth daily.   clonazePAM  0.5 MG tablet Commonly known as: KLONOPIN  Take 0.5 mg by mouth every 6 (six) hours.   cyclobenzaprine  10 MG tablet Commonly known as: FLEXERIL  Take 1 tablet (10 mg  total) by mouth 3 (three) times daily as needed for muscle spasms.   Dulaglutide  1.5 MG/0.5ML Soaj Inject 1.5 mg into the skin every Saturday.   empagliflozin  10 MG Tabs tablet Commonly known as: JARDIANCE  Take by mouth daily.   HYDROcodone -acetaminophen  5-325 MG tablet Commonly known as: NORCO/VICODIN Take 1-2 tablets by mouth every 4 (four) hours as needed for severe pain (pain score 7-10).   meloxicam  7.5 MG tablet Commonly known as: MOBIC  Take 7.5 mg by mouth in the morning and at bedtime.   simvastatin  5 MG tablet Commonly known as: ZOCOR  Take 5 mg by mouth daily.   venlafaxine  XR 75 MG 24 hr capsule Commonly known as: EFFEXOR -XR Take 75 mg by mouth daily.        Disposition: home   Final Dx: acdf C5-6, C6-7  Discharge Instructions      Remove dressing in 72 hours   Complete by: As directed    Call MD for:   Complete by: As directed    Call MD for:  difficulty breathing, headache or visual disturbances   Complete by: As directed    Call MD for:  hives   Complete by: As directed    Call MD for:  persistant dizziness or light-headedness   Complete by: As directed    Call MD for:  persistant nausea and vomiting   Complete by: As directed    Call MD for:  redness, tenderness, or signs of infection (pain, swelling, redness, odor or green/yellow discharge around incision site)   Complete by: As directed    Call MD for:  severe uncontrolled pain   Complete by: As directed    Call MD for:  temperature >100.4   Complete by: As directed    Diet - low sodium heart healthy   Complete by: As directed    Driving Restrictions   Complete by: As directed    No driving for 2 weeks, no riding in the car for 1 week   Increase activity slowly   Complete by: As directed    Lifting restrictions   Complete by: As directed    No lifting more than 8 lbs          Signed: Suzen Lacks Theia Dezeeuw 09/17/2024, 8:55 AM

## 2024-09-18 ENCOUNTER — Encounter (HOSPITAL_COMMUNITY): Payer: Self-pay | Admitting: Neurosurgery

## 2024-11-08 ENCOUNTER — Other Ambulatory Visit: Payer: Self-pay | Admitting: Neurosurgery

## 2024-12-18 ENCOUNTER — Ambulatory Visit (HOSPITAL_COMMUNITY): Admit: 2024-12-18 | Admitting: Neurosurgery

## 2024-12-18 SURGERY — POSTERIOR LUMBAR FUSION 1 WITH HARDWARE REMOVAL
Anesthesia: General | Site: Back
